# Patient Record
Sex: Female | Born: 1935 | Race: Black or African American | Hispanic: No | Smoking: Never smoker
Health system: Southern US, Community
[De-identification: ages and names within clinical notes are randomized; demographics above are authoritative.]

## PROBLEM LIST (undated history)

## (undated) DIAGNOSIS — E785 Hyperlipidemia, unspecified: Secondary | ICD-10-CM

## (undated) DIAGNOSIS — L309 Dermatitis, unspecified: Secondary | ICD-10-CM

## (undated) DIAGNOSIS — F0390 Unspecified dementia without behavioral disturbance: Secondary | ICD-10-CM

## (undated) DIAGNOSIS — I10 Essential (primary) hypertension: Secondary | ICD-10-CM

## (undated) DIAGNOSIS — R809 Proteinuria, unspecified: Secondary | ICD-10-CM

## (undated) DIAGNOSIS — R8281 Pyuria: Secondary | ICD-10-CM

## (undated) DIAGNOSIS — E119 Type 2 diabetes mellitus without complications: Secondary | ICD-10-CM

---

## 2004-08-01 ENCOUNTER — Emergency Department (HOSPITAL_COMMUNITY): Admission: EM | Admit: 2004-08-01 | Discharge: 2004-08-01 | Payer: Self-pay | Admitting: Emergency Medicine

## 2005-05-10 ENCOUNTER — Ambulatory Visit (HOSPITAL_COMMUNITY): Admission: RE | Admit: 2005-05-10 | Discharge: 2005-05-10 | Payer: Self-pay | Admitting: Gastroenterology

## 2005-12-19 ENCOUNTER — Encounter: Admission: RE | Admit: 2005-12-19 | Discharge: 2005-12-19 | Payer: Self-pay | Admitting: Internal Medicine

## 2006-05-09 ENCOUNTER — Ambulatory Visit: Payer: Self-pay | Admitting: Sports Medicine

## 2006-07-11 ENCOUNTER — Ambulatory Visit: Payer: Self-pay | Admitting: Sports Medicine

## 2006-09-05 ENCOUNTER — Ambulatory Visit: Payer: Self-pay | Admitting: Family Medicine

## 2006-09-26 ENCOUNTER — Ambulatory Visit (HOSPITAL_COMMUNITY): Admission: RE | Admit: 2006-09-26 | Discharge: 2006-09-26 | Payer: Self-pay | Admitting: Family Medicine

## 2006-09-26 ENCOUNTER — Ambulatory Visit: Payer: Self-pay | Admitting: Family Medicine

## 2006-12-28 DIAGNOSIS — M129 Arthropathy, unspecified: Secondary | ICD-10-CM | POA: Insufficient documentation

## 2006-12-28 DIAGNOSIS — E7800 Pure hypercholesterolemia, unspecified: Secondary | ICD-10-CM

## 2006-12-28 DIAGNOSIS — E119 Type 2 diabetes mellitus without complications: Secondary | ICD-10-CM | POA: Insufficient documentation

## 2006-12-28 DIAGNOSIS — I10 Essential (primary) hypertension: Secondary | ICD-10-CM

## 2007-03-13 ENCOUNTER — Telehealth: Payer: Self-pay | Admitting: *Deleted

## 2007-04-17 ENCOUNTER — Ambulatory Visit: Payer: Self-pay | Admitting: Family Medicine

## 2007-04-17 LAB — CONVERTED CEMR LAB
ALT: 18 units/L (ref 0–35)
AST: 13 units/L (ref 0–37)
Albumin: 4.4 g/dL (ref 3.5–5.2)
Alkaline Phosphatase: 82 units/L (ref 39–117)
BUN: 14 mg/dL (ref 6–23)
CO2: 26 meq/L (ref 19–32)
Calcium: 10.1 mg/dL (ref 8.4–10.5)
Chloride: 104 meq/L (ref 96–112)
Cholesterol: 201 mg/dL — ABNORMAL HIGH (ref 0–200)
Creatinine, Ser: 0.93 mg/dL (ref 0.40–1.20)
Glucose, Bld: 117 mg/dL — ABNORMAL HIGH (ref 70–99)
HDL: 60 mg/dL (ref >39)
Hgb A1c MFr Bld: 7 %
LDL Cholesterol: 112 mg/dL — ABNORMAL HIGH (ref 0–99)
Potassium: 3.9 meq/L (ref 3.5–5.3)
Sodium: 141 meq/L (ref 135–145)
Total Bilirubin: 0.5 mg/dL (ref 0.3–1.2)
Total CHOL/HDL Ratio: 3.4
Total Protein: 7.4 g/dL (ref 6.0–8.3)
Triglycerides: 145 mg/dL (ref <150)
VLDL: 29 mg/dL (ref 0–40)

## 2007-05-29 ENCOUNTER — Encounter: Payer: Self-pay | Admitting: *Deleted

## 2007-07-03 ENCOUNTER — Encounter: Payer: Self-pay | Admitting: Family Medicine

## 2007-10-12 ENCOUNTER — Encounter: Payer: Self-pay | Admitting: *Deleted

## 2008-03-31 ENCOUNTER — Emergency Department (HOSPITAL_COMMUNITY): Admission: EM | Admit: 2008-03-31 | Discharge: 2008-04-01 | Payer: Self-pay | Admitting: Emergency Medicine

## 2008-05-29 ENCOUNTER — Encounter: Payer: Self-pay | Admitting: *Deleted

## 2008-06-15 IMAGING — CT CT HEAD W/O CM
1 of 3 series · 14 of 30 positions shown, 18 images · IV contrast (agent unspecified)
Comparison: None available.

CLINICAL DATA: 84-year-old female status post fall, blurred vision.  
 HEAD CT WITHOUT CONTRAST:
TECHNIQUE: Contiguous axial images were obtained from the base of the skull through the vertex according to standard protocol without contrast.

[Series 3: recon 2: brain · axial · 0.47mm/px · z∈[+123,+239]mm · 14 of 72 slices shown, 18 images]
[im 5/72  brain]
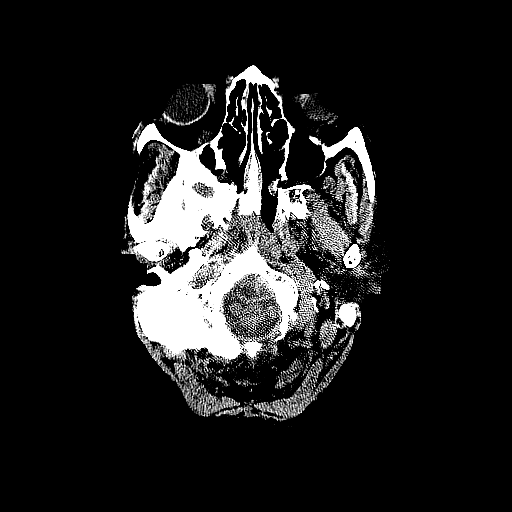
[im 5/72  bone]
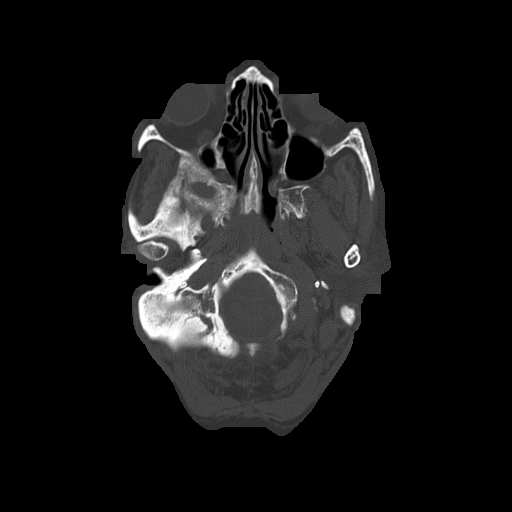
[im 10/72  brain]
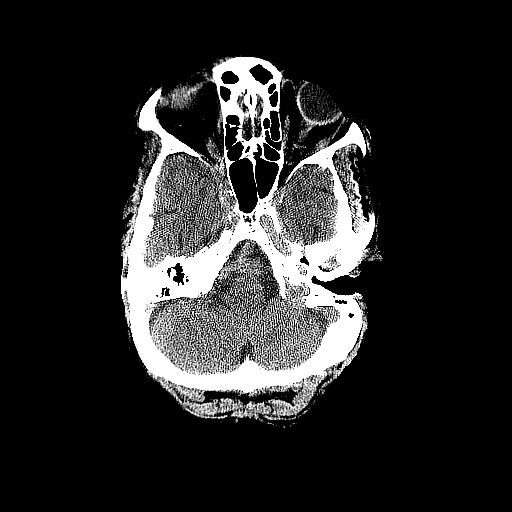
[im 15/72  brain]
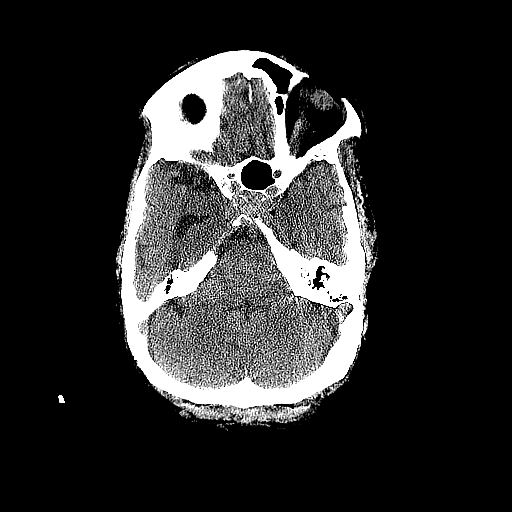
[im 19/72  brain]
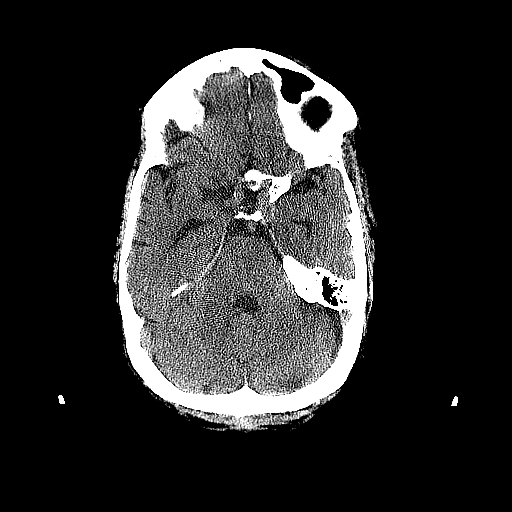
[im 24/72  brain]
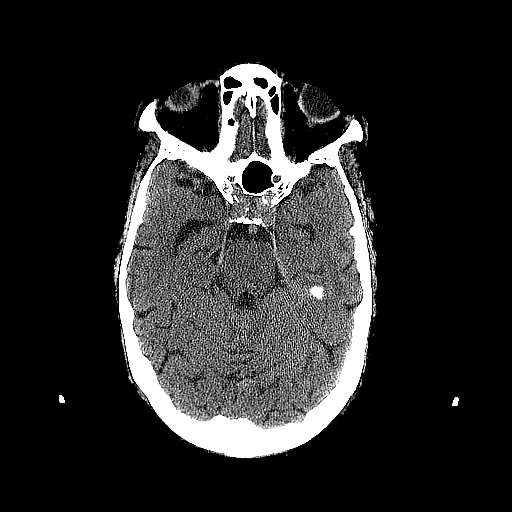
[im 24/72  bone]
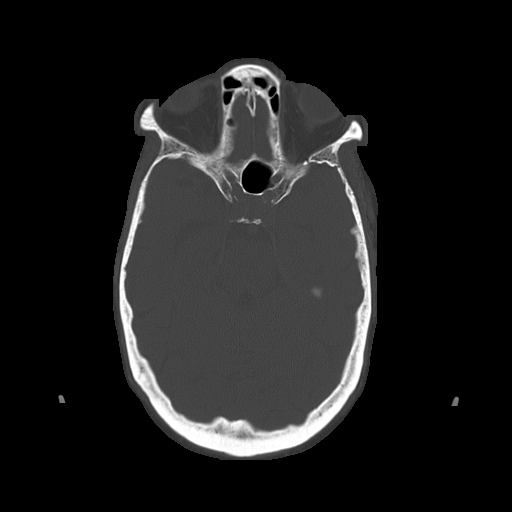
[im 29/72  brain]
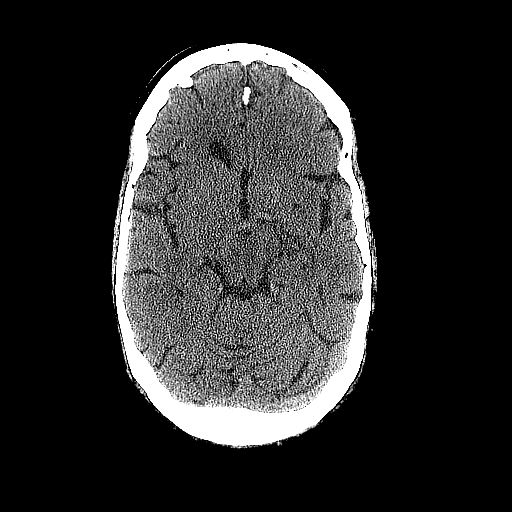
[im 34/72  brain]
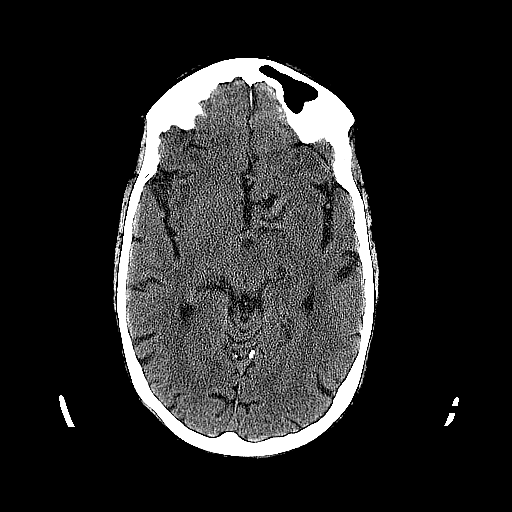
[im 38/72  brain]
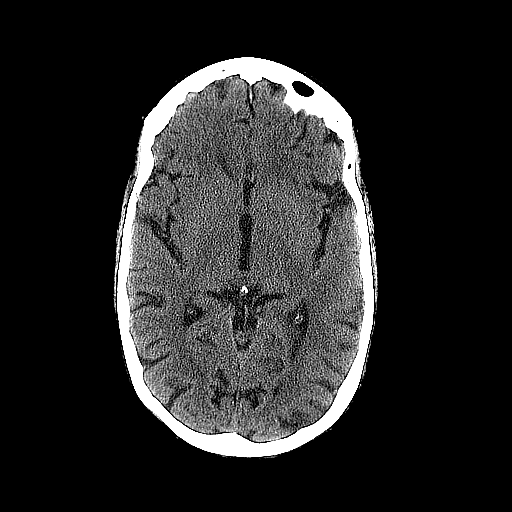
[im 43/72  brain]
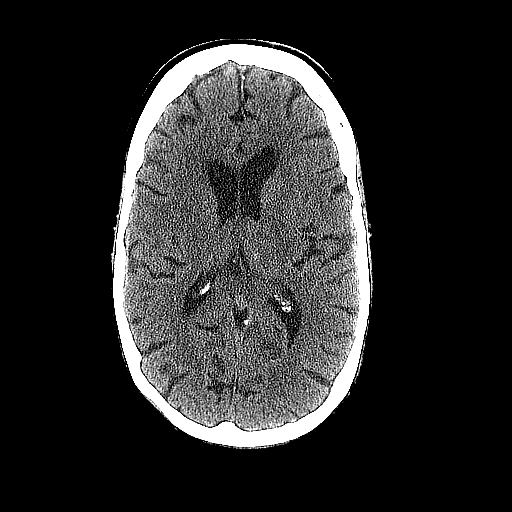
[im 43/72  bone]
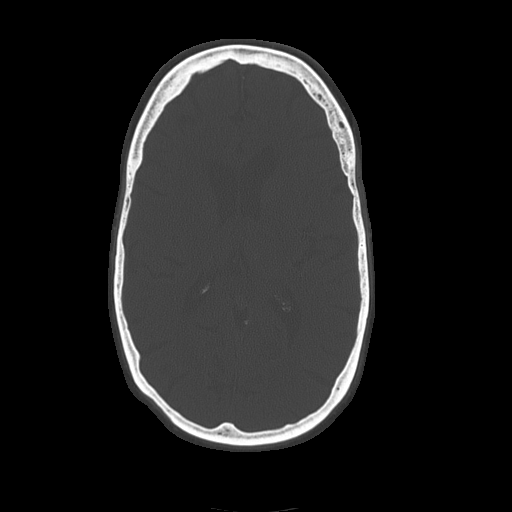
[im 48/72  brain]
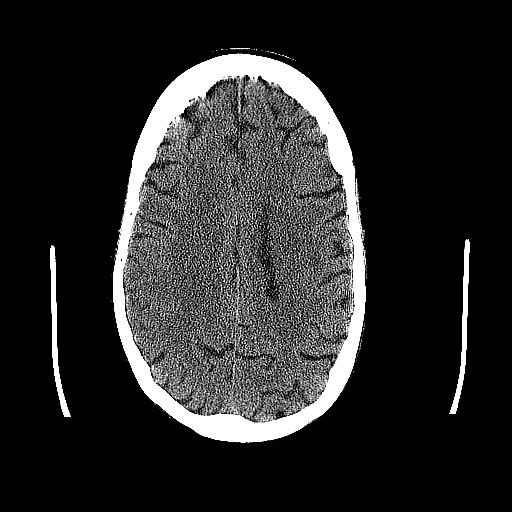
[im 53/72  brain]
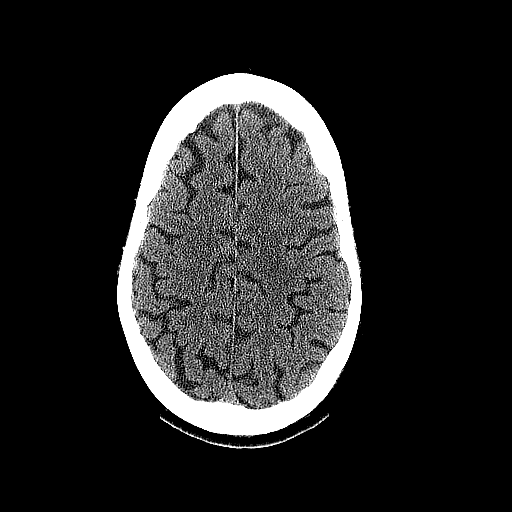
[im 57/72  brain]
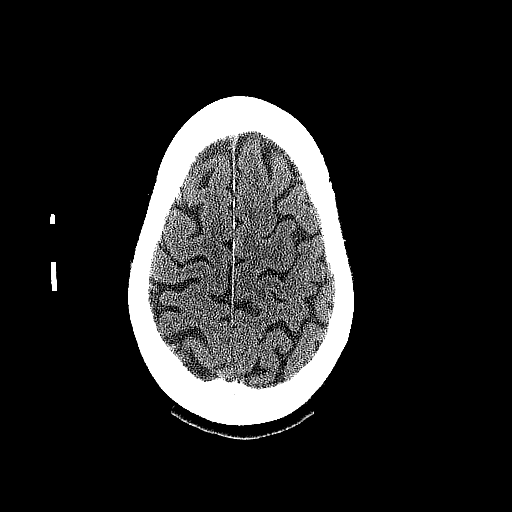
[im 62/72  brain]
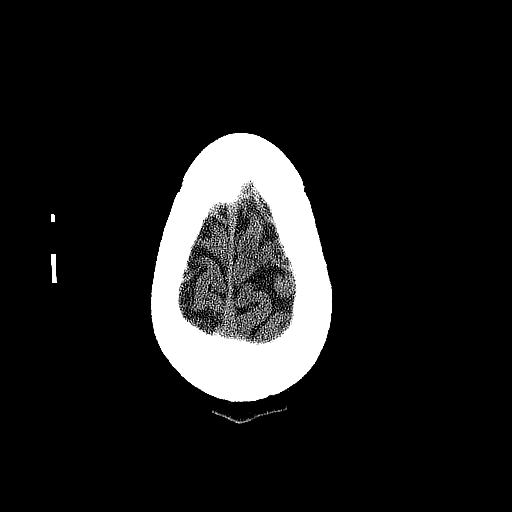
[im 62/72  bone]
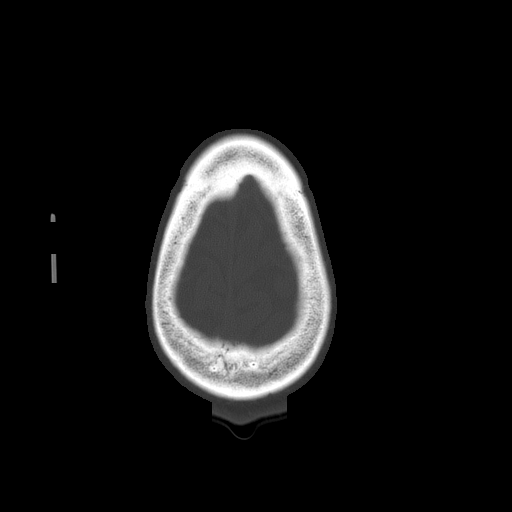
[im 67/72  brain]
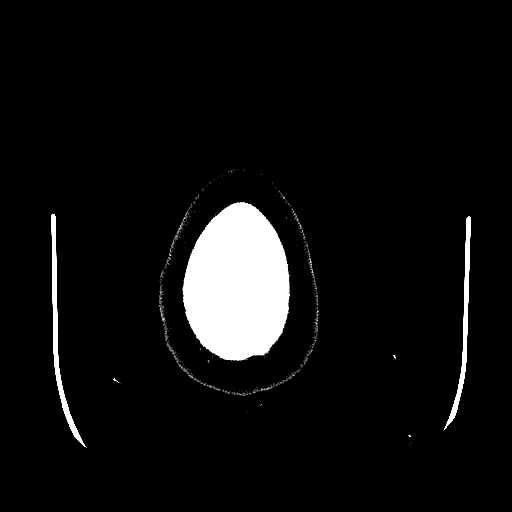

[14 of 30 positions shown; findings below may reference images not displayed]

FINDINGS: Mild atrophy is within normal limits for age.  No acute intracranial abnormality is present. Specifically, there is no evidence for acute infarct, hemorrhage, mass, hydrocephalus or extraaxial fluid collection.  There is some fullness of the cavernous sinus bilaterally.  This in part may represent ectatic internal carotid arteries.  Pituitary disease cannot be excluded.  No focal extracranial soft tissue abnormality is appreciated.  Paranasal soft tissues are within normal limits.  Right mastoid air cells are partially sclerosed.  No acute fracture is evident.
IMPRESSION: 1.  Moderate prominence of the cavernous sinus bilaterally and sellar structures.  Pituitary lesion is not excluded.  Recommend dedicated MRI for further evaluation.  
 2.  Mild atrophy is likely within normal limits for age.  
 3.  Sclerosis of right mastoid air cells compatible with chronic or recurrent right mastoiditis.  There is no active disease.  
 These findings were called to Dr. Mariely Lawley?[REDACTED] 09/26/06.

## 2009-01-13 ENCOUNTER — Emergency Department (HOSPITAL_COMMUNITY): Admission: EM | Admit: 2009-01-13 | Discharge: 2009-01-13 | Payer: Self-pay | Admitting: Emergency Medicine

## 2011-02-10 LAB — URINE MICROSCOPIC-ADD ON

## 2011-02-10 LAB — URINALYSIS, ROUTINE W REFLEX MICROSCOPIC
Bilirubin Urine: NEGATIVE
Glucose, UA: 250 mg/dL — AB
Hgb urine dipstick: NEGATIVE
Ketones, ur: 15 mg/dL — AB
Leukocytes, UA: NEGATIVE
Nitrite: NEGATIVE
Protein, ur: 100 mg/dL — AB
Specific Gravity, Urine: 1.018 (ref 1.005–1.030)
Urobilinogen, UA: 1 mg/dL (ref 0.0–1.0)
pH: 6 (ref 5.0–8.0)

## 2011-02-10 LAB — COMPREHENSIVE METABOLIC PANEL
ALT: 18 U/L (ref 0–35)
AST: 19 U/L (ref 0–37)
Albumin: 3.9 g/dL (ref 3.5–5.2)
Alkaline Phosphatase: 62 U/L (ref 39–117)
BUN: 16 mg/dL (ref 6–23)
CO2: 25 mEq/L (ref 19–32)
Calcium: 9.9 mg/dL (ref 8.4–10.5)
Chloride: 104 mEq/L (ref 96–112)
Creatinine, Ser: 1.3 mg/dL — ABNORMAL HIGH (ref 0.4–1.2)
GFR calc Af Amer: 47 mL/min — ABNORMAL LOW (ref >60)
GFR calc non Af Amer: 39 mL/min — ABNORMAL LOW (ref >60)
Glucose, Bld: 274 mg/dL — ABNORMAL HIGH (ref 70–99)
Potassium: 3.7 mEq/L (ref 3.5–5.1)
Sodium: 142 mEq/L (ref 135–145)
Total Bilirubin: 1 mg/dL (ref 0.3–1.2)
Total Protein: 7.1 g/dL (ref 6.0–8.3)

## 2011-02-10 LAB — DIFFERENTIAL
Basophils Absolute: 0 10*3/uL (ref 0.0–0.1)
Basophils Relative: 0 % (ref 0–1)
Eosinophils Absolute: 0 10*3/uL (ref 0.0–0.7)
Eosinophils Relative: 0 % (ref 0–5)
Lymphocytes Relative: 7 % — ABNORMAL LOW (ref 12–46)
Lymphs Abs: 0.7 10*3/uL (ref 0.7–4.0)
Monocytes Absolute: 0.1 10*3/uL (ref 0.1–1.0)
Monocytes Relative: 1 % — ABNORMAL LOW (ref 3–12)
Neutro Abs: 8.8 10*3/uL — ABNORMAL HIGH (ref 1.7–7.7)
Neutrophils Relative %: 91 % — ABNORMAL HIGH (ref 43–77)

## 2011-02-10 LAB — CBC
HCT: 49.4 % — ABNORMAL HIGH (ref 36.0–46.0)
Hemoglobin: 16.5 g/dL — ABNORMAL HIGH (ref 12.0–15.0)
MCHC: 33.4 g/dL (ref 30.0–36.0)
MCV: 91.3 fL (ref 78.0–100.0)
Platelets: 227 10*3/uL (ref 150–400)
RBC: 5.42 MIL/uL — ABNORMAL HIGH (ref 3.87–5.11)
RDW: 12.7 % (ref 11.5–15.5)
WBC: 9.7 10*3/uL (ref 4.0–10.5)

## 2011-02-10 LAB — URINE CULTURE: Colony Count: 10000

## 2011-02-10 LAB — LIPASE, BLOOD: Lipase: 16 U/L (ref 11–59)

## 2011-03-18 NOTE — Op Note (Signed)
NAME:  Gloria Velasquez, Gloria Velasquez              ACCOUNT NO.:  [id number]   MEDICAL RECORD NO.:  [id number]          PATIENT TYPE:  AMB   LOCATION:  ENDO                         FACILITY:  [facility]   PHYSICIAN:  Danise Edge, M.D.   DATE OF BIRTH:  01-26-1922   DATE OF PROCEDURE:  05/10/2005  DATE OF DISCHARGE:                                 OPERATIVE REPORT   PROCEDURE:  Screening flexible proctosigmoidoscopy.   PROCEDURE INDICATIONS:  [name] is an 77 year old female born  01/13/1922. [name] was scheduled to undergo her first screening  colonoscopy with polypectomy to prevent colon cancer. Due to colonic loop  formation, possibly attributed by adhesions from her remote hysterectomy, I  was unable to perform a complete colonoscopy.   I discussed with [name] the complications associated with colonoscopy  and polypectomy including a 15/1000 risk of bleeding and 10/998 risk of  colon perforation requiring surgical repair. [name] has signed the  operative permit.   ENDOSCOPIST:  Danise Edge, M.D.   PREMEDICATION:  Versed 5 mg, Demerol 30 mg.   PROCEDURE:  After obtaining informed consent, [name] was placed in the  left lateral decubitus position. I administered intravenous Demerol and  intravenous Versed to achieve conscious sedation for the procedure. The  patient's blood pressure, oxygen saturation and cardiac rhythm were  monitored throughout the procedure and documented in the medical record.   Anal inspection and digital rectal exam were normal. The Olympus adjustable  pediatric colonoscope was introduced into the rectum and advanced to  approximately 60 cm from the anal verge. Due to colonic loop formation, I  was unable to advance the colonoscope proximally. The patient was  repositioned from the left lateral decubitus position to the supine position  and finally the right lateral decubitus position but I was still unable to  advance the  endoscope more proximally.   Colonic preparation for the exam today was excellent.   Endoscopic appearance of the rectal mucosa was normal. Endoscopic appearance  of the sigmoid colon and descending colon with the endoscope advanced to 60  cm from the anal verge was normal. There was no endoscopic evidence for the  presence of colorectal neoplasia by screening flexible proctosigmoidoscopy  to 60 cm today.   ASSESSMENT:  1.  Incomplete colonoscopy due to colonic loop formation.  2.  Normal screening flexible proctosigmoidoscopy to 60 cm.   PLAN:  I will schedule [name] for a virtual colonoscopy.       MJ/MEDQ  D:  05/10/2005  T:  05/10/2005  Job:  [id number]   cc:   Ladell Pier, M.D.  Fax: [phone]

## 2011-05-27 ENCOUNTER — Emergency Department (HOSPITAL_COMMUNITY)
Admission: EM | Admit: 2011-05-27 | Discharge: 2011-05-27 | Disposition: A | Discharge status: Home / Self Care | Payer: PRIVATE HEALTH INSURANCE | Attending: Emergency Medicine | Admitting: Emergency Medicine

## 2011-05-27 DIAGNOSIS — Z049 Encounter for examination and observation for unspecified reason: Secondary | ICD-10-CM | POA: Insufficient documentation

## 2011-05-27 DIAGNOSIS — R109 Unspecified abdominal pain: Secondary | ICD-10-CM | POA: Insufficient documentation

## 2011-07-28 LAB — URINALYSIS, ROUTINE W REFLEX MICROSCOPIC
Bilirubin Urine: NEGATIVE
Glucose, UA: 100 — AB
Hgb urine dipstick: NEGATIVE
Ketones, ur: 15 — AB
Nitrite: NEGATIVE
Protein, ur: 100 — AB
Specific Gravity, Urine: 1.009
Urobilinogen, UA: 1
pH: 7

## 2011-07-28 LAB — DIFFERENTIAL
Basophils Absolute: 0
Basophils Relative: 0
Eosinophils Absolute: 0.1
Eosinophils Relative: 1
Lymphocytes Relative: 14
Lymphs Abs: 1.2
Monocytes Absolute: 0.2
Monocytes Relative: 2 — ABNORMAL LOW
Neutro Abs: 7
Neutrophils Relative %: 83 — ABNORMAL HIGH

## 2011-07-28 LAB — CBC
HCT: 39.1
Hemoglobin: 13.3
MCHC: 34
MCV: 88.7
Platelets: 236
RBC: 4.41
RDW: 12.8
WBC: 8.4

## 2011-07-28 LAB — POCT I-STAT, CHEM 8
BUN: 13
Calcium, Ion: 1.18
Chloride: 104
Creatinine, Ser: 1
Glucose, Bld: 176 — ABNORMAL HIGH
HCT: 41
Hemoglobin: 13.9
Potassium: 3.7
Sodium: 140
TCO2: 27

## 2011-07-28 LAB — URINE MICROSCOPIC-ADD ON

## 2011-07-28 LAB — POCT CARDIAC MARKERS
CKMB, poc: 1.5
Myoglobin, poc: 58.2
Operator id: 244461
Troponin i, poc: <0.05

## 2011-08-19 ENCOUNTER — Emergency Department (HOSPITAL_COMMUNITY): Payer: PRIVATE HEALTH INSURANCE

## 2011-08-19 ENCOUNTER — Emergency Department (HOSPITAL_COMMUNITY)
Admission: EM | Admit: 2011-08-19 | Discharge: 2011-08-19 | Disposition: A | Discharge status: Home / Self Care | Payer: PRIVATE HEALTH INSURANCE | Attending: Emergency Medicine | Admitting: Emergency Medicine

## 2011-08-19 DIAGNOSIS — R112 Nausea with vomiting, unspecified: Secondary | ICD-10-CM | POA: Insufficient documentation

## 2011-08-19 DIAGNOSIS — I10 Essential (primary) hypertension: Secondary | ICD-10-CM | POA: Insufficient documentation

## 2011-08-19 DIAGNOSIS — F0390 Unspecified dementia without behavioral disturbance: Secondary | ICD-10-CM | POA: Insufficient documentation

## 2011-08-19 DIAGNOSIS — E119 Type 2 diabetes mellitus without complications: Secondary | ICD-10-CM | POA: Insufficient documentation

## 2011-08-19 LAB — CBC
HCT: 40.5 % (ref 36.0–46.0)
Hemoglobin: 13.9 g/dL (ref 12.0–15.0)
MCH: 30.5 pg (ref 26.0–34.0)
MCHC: 34.3 g/dL (ref 30.0–36.0)
MCV: 88.8 fL (ref 78.0–100.0)
Platelets: 238 10*3/uL (ref 150–400)
RBC: 4.56 MIL/uL (ref 3.87–5.11)
RDW: 12.4 % (ref 11.5–15.5)
WBC: 6.1 10*3/uL (ref 4.0–10.5)

## 2011-08-19 LAB — DIFFERENTIAL
Basophils Absolute: 0 10*3/uL (ref 0.0–0.1)
Basophils Relative: 1 % (ref 0–1)
Eosinophils Absolute: 0 10*3/uL (ref 0.0–0.7)
Eosinophils Relative: 1 % (ref 0–5)
Lymphocytes Relative: 23 % (ref 12–46)
Lymphs Abs: 1.4 10*3/uL (ref 0.7–4.0)
Monocytes Absolute: 0.3 10*3/uL (ref 0.1–1.0)
Monocytes Relative: 5 % (ref 3–12)
Neutro Abs: 4.4 10*3/uL (ref 1.7–7.7)
Neutrophils Relative %: 71 % (ref 43–77)

## 2011-08-19 LAB — URINALYSIS, ROUTINE W REFLEX MICROSCOPIC
Bilirubin Urine: NEGATIVE
Glucose, UA: 500 mg/dL — AB
Hgb urine dipstick: NEGATIVE
Ketones, ur: NEGATIVE mg/dL
Nitrite: NEGATIVE
Protein, ur: NEGATIVE mg/dL
Specific Gravity, Urine: 1.013 (ref 1.005–1.030)
Urobilinogen, UA: 1 mg/dL (ref 0.0–1.0)
pH: 7 (ref 5.0–8.0)

## 2011-08-19 LAB — POCT I-STAT TROPONIN I: Troponin i, poc: 0.01 ng/mL (ref 0.00–0.08)

## 2011-08-19 LAB — URINE MICROSCOPIC-ADD ON

## 2011-08-19 LAB — BASIC METABOLIC PANEL
BUN: 11 mg/dL (ref 6–23)
CO2: 27 mEq/L (ref 19–32)
Calcium: 10.3 mg/dL (ref 8.4–10.5)
Chloride: 100 mEq/L (ref 96–112)
Creatinine, Ser: 0.92 mg/dL (ref 0.50–1.10)
GFR calc Af Amer: 63 mL/min — ABNORMAL LOW (ref >90)
GFR calc non Af Amer: 54 mL/min — ABNORMAL LOW (ref >90)
Glucose, Bld: 265 mg/dL — ABNORMAL HIGH (ref 70–99)
Potassium: 4 mEq/L (ref 3.5–5.1)
Sodium: 138 mEq/L (ref 135–145)

## 2011-08-20 LAB — URINE CULTURE: Colony Count: 55000

## 2012-06-06 ENCOUNTER — Other Ambulatory Visit: Payer: Self-pay | Admitting: Internal Medicine

## 2012-06-06 DIAGNOSIS — M8580 Other specified disorders of bone density and structure, unspecified site: Secondary | ICD-10-CM

## 2012-06-06 DIAGNOSIS — Z1231 Encounter for screening mammogram for malignant neoplasm of breast: Secondary | ICD-10-CM

## 2012-06-26 ENCOUNTER — Ambulatory Visit
Admission: RE | Admit: 2012-06-26 | Discharge: 2012-06-26 | Disposition: A | Discharge status: Home / Self Care | Payer: PRIVATE HEALTH INSURANCE | Source: Ambulatory Visit | Attending: Internal Medicine | Admitting: Internal Medicine

## 2012-06-26 DIAGNOSIS — M8580 Other specified disorders of bone density and structure, unspecified site: Secondary | ICD-10-CM

## 2012-06-26 DIAGNOSIS — Z1231 Encounter for screening mammogram for malignant neoplasm of breast: Secondary | ICD-10-CM

## 2013-05-10 ENCOUNTER — Emergency Department (HOSPITAL_COMMUNITY)
Admission: EM | Admit: 2013-05-10 | Discharge: 2013-05-10 | Disposition: A | Discharge status: Home / Self Care | Payer: PRIVATE HEALTH INSURANCE | Attending: Emergency Medicine | Admitting: Emergency Medicine

## 2013-05-10 ENCOUNTER — Encounter (HOSPITAL_COMMUNITY): Payer: Self-pay

## 2013-05-10 ENCOUNTER — Emergency Department (HOSPITAL_COMMUNITY): Payer: PRIVATE HEALTH INSURANCE

## 2013-05-10 DIAGNOSIS — Z79899 Other long term (current) drug therapy: Secondary | ICD-10-CM | POA: Insufficient documentation

## 2013-05-10 DIAGNOSIS — G309 Alzheimer's disease, unspecified: Secondary | ICD-10-CM | POA: Insufficient documentation

## 2013-05-10 DIAGNOSIS — Z872 Personal history of diseases of the skin and subcutaneous tissue: Secondary | ICD-10-CM | POA: Insufficient documentation

## 2013-05-10 DIAGNOSIS — F0280 Dementia in other diseases classified elsewhere without behavioral disturbance: Secondary | ICD-10-CM | POA: Insufficient documentation

## 2013-05-10 DIAGNOSIS — I10 Essential (primary) hypertension: Secondary | ICD-10-CM | POA: Insufficient documentation

## 2013-05-10 DIAGNOSIS — R109 Unspecified abdominal pain: Secondary | ICD-10-CM | POA: Insufficient documentation

## 2013-05-10 DIAGNOSIS — Z87440 Personal history of urinary (tract) infections: Secondary | ICD-10-CM | POA: Insufficient documentation

## 2013-05-10 DIAGNOSIS — E785 Hyperlipidemia, unspecified: Secondary | ICD-10-CM | POA: Insufficient documentation

## 2013-05-10 DIAGNOSIS — E119 Type 2 diabetes mellitus without complications: Secondary | ICD-10-CM | POA: Insufficient documentation

## 2013-05-10 HISTORY — DX: Unspecified dementia, unspecified severity, without behavioral disturbance, psychotic disturbance, mood disturbance, and anxiety: F03.90

## 2013-05-10 HISTORY — DX: Dermatitis, unspecified: L30.9

## 2013-05-10 HISTORY — DX: Hyperlipidemia, unspecified: E78.5

## 2013-05-10 HISTORY — DX: Proteinuria, unspecified: R80.9

## 2013-05-10 HISTORY — DX: Essential (primary) hypertension: I10

## 2013-05-10 HISTORY — DX: Type 2 diabetes mellitus without complications: E11.9

## 2013-05-10 HISTORY — DX: Pyuria: R82.81

## 2013-05-10 LAB — CBC WITH DIFFERENTIAL/PLATELET
Basophils Relative: 1 % (ref 0–1)
Eosinophils Absolute: 0.1 10*3/uL (ref 0.0–0.7)
MCH: 29.7 pg (ref 26.0–34.0)
MCHC: 33.8 g/dL (ref 30.0–36.0)
Neutrophils Relative %: 51 % (ref 43–77)
Platelets: 205 10*3/uL (ref 150–400)

## 2013-05-10 LAB — COMPREHENSIVE METABOLIC PANEL
ALT: 13 U/L (ref 0–35)
Albumin: 3.7 g/dL (ref 3.5–5.2)
Alkaline Phosphatase: 63 U/L (ref 39–117)
BUN: 10 mg/dL (ref 6–23)
Potassium: 3 mEq/L — ABNORMAL LOW (ref 3.5–5.1)
Sodium: 141 mEq/L (ref 135–145)
Total Protein: 7.2 g/dL (ref 6.0–8.3)

## 2013-05-10 LAB — URINE MICROSCOPIC-ADD ON

## 2013-05-10 LAB — URINALYSIS, ROUTINE W REFLEX MICROSCOPIC
Bilirubin Urine: NEGATIVE
Nitrite: NEGATIVE
Specific Gravity, Urine: 1.009 (ref 1.005–1.030)
pH: 7 (ref 5.0–8.0)

## 2013-05-10 LAB — LIPASE, BLOOD: Lipase: 30 U/L (ref 11–59)

## 2013-05-10 LAB — LACTIC ACID, PLASMA: Lactic Acid, Venous: 2.5 mmol/L — ABNORMAL HIGH (ref 0.5–2.2)

## 2013-05-10 MED ORDER — IOHEXOL 300 MG/ML  SOLN
100.0000 mL | Freq: Once | INTRAMUSCULAR | Status: AC | PRN
  Administered 2013-05-10: 100 mL via INTRAVENOUS

## 2013-05-10 MED ORDER — POTASSIUM CHLORIDE CRYS ER 20 MEQ PO TBCR
60.0000 meq | EXTENDED_RELEASE_TABLET | Freq: Once | ORAL | Status: AC
  Administered 2013-05-10: 60 meq via ORAL
  Filled 2013-05-10: qty 3

## 2013-05-10 MED ORDER — SODIUM CHLORIDE 0.9 % IV BOLUS (SEPSIS)
1000.0000 mL | Freq: Once | INTRAVENOUS | Status: AC
  Administered 2013-05-10: 1000 mL via INTRAVENOUS

## 2013-05-10 MED ORDER — IOHEXOL 300 MG/ML  SOLN
25.0000 mL | INTRAMUSCULAR | Status: AC
  Administered 2013-05-10: 25 mL via ORAL

## 2013-05-10 NOTE — ED Notes (Signed)
Patient up to restroom with family member with NAD.

## 2013-05-10 NOTE — ED Notes (Signed)
Patient's daughter states the patient has not been eating or drinking well x 2 weeks. Patient appears to have dementia and is not a good historian. Family denies any health issues other that patient telling family has right side abdominal pain.

## 2013-05-10 NOTE — ED Notes (Signed)
Pt presents with 1 week h/o abdominal pain. +nausea;  Pt reports intermittent dysuria.  Daughter reports pt has had good appetite, reports umbilical pain.

## 2013-05-10 NOTE — ED Provider Notes (Signed)
Pt care assumed from Dr Mora Bellman, see his note for complete H&P. Briefly pt w/ alzheimer's now w/ abd pain x 2 days, possible RLQ pain, labs unremarkable, CT and u/a pending. Vitals stable, NAD  Reassessed, vitals stable, NAD, denies pain, abd soft and benign. Nttp. Repeat lactic acid cleared w/ 1 L IVf. CT abd - no acute findings. U/a neg for infection. Stable for d/c home. Follow up w/ pcp on Monday. Given strict return precautions.   1. Abdominal  pain, other specified site       follow up with your primary care doctor on monday   Results for orders placed during the hospital encounter of 05/10/13  CBC WITH DIFFERENTIAL      Result Value Range   WBC 4.7  4.0 - 10.5 K/uL   RBC 4.44  3.87 - 5.11 MIL/uL   Hemoglobin 13.2  12.0 - 15.0 g/dL   HCT 40.9  81.1 - 91.4 %   MCV 88.1  78.0 - 100.0 fL   MCH 29.7  26.0 - 34.0 pg   MCHC 33.8  30.0 - 36.0 g/dL   RDW 78.2  95.6 - 21.3 %   Platelets 205  150 - 400 K/uL   Neutrophils Relative % 51  43 - 77 %   Neutro Abs 2.4  1.7 - 7.7 K/uL   Lymphocytes Relative 40  12 - 46 %   Lymphs Abs 1.9  0.7 - 4.0 K/uL   Monocytes Relative 6  3 - 12 %   Monocytes Absolute 0.3  0.1 - 1.0 K/uL   Eosinophils Relative 3  0 - 5 %   Eosinophils Absolute 0.1  0.0 - 0.7 K/uL   Basophils Relative 1  0 - 1 %   Basophils Absolute 0.1  0.0 - 0.1 K/uL  COMPREHENSIVE METABOLIC PANEL      Result Value Range   Sodium 141  135 - 145 mEq/L   Potassium 3.0 (*) 3.5 - 5.1 mEq/L   Chloride 101  96 - 112 mEq/L   CO2 29  19 - 32 mEq/L   Glucose, Bld 104 (*) 70 - 99 mg/dL   BUN 10  6 - 23 mg/dL   Creatinine, Ser 0.86  0.50 - 1.10 mg/dL   Calcium 57.8  8.4 - 46.9 mg/dL   Total Protein 7.2  6.0 - 8.3 g/dL   Albumin 3.7  3.5 - 5.2 g/dL   AST 12  0 - 37 U/L   ALT 13  0 - 35 U/L   Alkaline Phosphatase 63  39 - 117 U/L   Total Bilirubin 0.3  0.3 - 1.2 mg/dL   GFR calc non Af Amer 54 (*) >90 mL/min   GFR calc Af Amer 63 (*) >90 mL/min  LIPASE, BLOOD      Result Value Range   Lipase 30  11 - 59 U/L  URINALYSIS, ROUTINE W REFLEX MICROSCOPIC      Result Value Range   Color, Urine YELLOW  YELLOW   APPearance CLEAR  CLEAR   Specific Gravity, Urine 1.009  1.005 - 1.030   pH 7.0  5.0 - 8.0   Glucose, UA NEGATIVE  NEGATIVE mg/dL   Hgb urine dipstick NEGATIVE  NEGATIVE   Bilirubin Urine NEGATIVE  NEGATIVE   Ketones, ur NEGATIVE  NEGATIVE mg/dL   Protein, ur NEGATIVE  NEGATIVE mg/dL   Urobilinogen, UA 0.2  0.0 - 1.0 mg/dL   Nitrite NEGATIVE  NEGATIVE   Leukocytes, UA LARGE (*) NEGATIVE  LACTIC ACID, PLASMA      Result Value Range   Lactic Acid, Venous 2.5 (*) 0.5 - 2.2 mmol/L  URINE MICROSCOPIC-ADD ON      Result Value Range   Squamous Epithelial / LPF FEW (*) RARE   WBC, UA 3-6  <3 WBC/hpf  CG4 I-STAT (LACTIC ACID)      Result Value Range   Lactic Acid, Venous 1.71  0.5 - 2.2 mmol/L   CT Abdomen Pelvis W Contrast (Final result)  Result time: 05/10/13 18:28:40    Final result by Rad Results In Interface (05/10/13 18:28:40)    Narrative:   *RADIOLOGY REPORT*  Clinical Data: Right-sided abdominal pain. Anorexia. Clinical suspicion for appendicitis.  CT ABDOMEN AND PELVIS WITH CONTRAST  Technique: Multidetector CT imaging of the abdomen and pelvis was performed following the standard protocol during bolus administration of intravenous contrast.  Contrast: OMNIPAQUE IOHEXOL 300 MG/ML SOLN  Comparison: None.  Findings: A few tiny hepatic cysts are seen but no liver masses are identified. Surgical clips seen from prior cholecystectomy. The spleen, pancreas, and adrenal glands are normal appearance. A few small right renal cysts are also noted but there is no evidence of renal masses or hydronephrosis.  Prior hysterectomy noted. Adnexal regions are unremarkable. No soft tissue masses or lymphadenopathy identified within the abdomen or pelvis. No evidence of inflammatory process or abnormal fluid collections. Index is not directly visualized  but there are no findings of appendicitis. No evidence of dilated bowel loops or hernia.  IMPRESSION: No evidence of appendicitis or other acute findings.   Original Report Authenticated By: Myles Rosenthal, M.D.          Audelia Hives, MD 05/10/13 2013

## 2013-05-10 NOTE — ED Notes (Signed)
Pt's family comfortable with d/c instructions.

## 2013-05-10 NOTE — ED Provider Notes (Signed)
History    CSN: [id number] Arrival date & time 05/10/13  1301  First MD Initiated Contact with Patient 05/10/13 1423     No chief complaint on file.  (Consider location/radiation/quality/duration/timing/severity/associated sxs/prior Treatment) HPI Comments: Gloria Velasquez is a 79 y.o. female h/o alzheimer's, here with intermittent abdominal pain for the past week.  Per family, patient will randomly admit to the pain without being able to describe it.  It is worse in the mornings and at night.  They deny any N/V/D changes in output, fevers, chills, or AMS.  Patient is at her baseline mentally.  She does have a history of frequent UTIs.  ROs is unobtainable.  The history is provided by the patient and a relative.   Past Medical History  Diagnosis Date  . Microalbuminuria   . Pyuria   . Dermatitis   . Dementia   . Diabetes mellitus without complication   . Hyperlipidemia   . Hypertension    History reviewed. No pertinent past surgical history. History reviewed. No pertinent family history. History  Substance Use Topics  . Smoking status: Never Smoker   . Smokeless tobacco: Not on file  . Alcohol Use: No   OB History   Grav Para Term Preterm Abortions TAB SAB Ect Mult Living                 Review of Systems  Unable to perform ROS: Age    Allergies  Review of patient's allergies indicates no known allergies.  Home Medications   Current Outpatient Rx  Name  Route  Sig  Dispense  Refill  . amLODipine (NORVASC) 5 MG tablet   Oral   Take 5 mg by mouth daily.         Marland Kitchen donepezil (ARICEPT) 10 MG tablet   Oral   Take 10 mg by mouth at bedtime.         Marland Kitchen glyBURIDE (DIABETA) 5 MG tablet   Oral   Take 5 mg by mouth 2 (two) times daily with a meal.         . simvastatin (ZOCOR) 40 MG tablet   Oral   Take 40 mg by mouth every evening.          BP 157/76  Pulse 60  Temp(Src) 98 F (36.7 C) (Oral)  Resp 18  SpO2 99% Physical Exam  Nursing note and  vitals reviewed. Constitutional: She is oriented to person, place, and time. She appears well-developed and well-nourished. No distress.  HENT:  Head: Normocephalic and atraumatic.  Nose: Nose normal.  Mouth/Throat: Oropharynx is clear and moist. No oropharyngeal exudate.  Eyes: Conjunctivae and EOM are normal. Pupils are equal, round, and reactive to light. No scleral icterus.  Neck: Normal range of motion. Neck supple. No JVD present. No tracheal deviation present. No thyromegaly present.  Cardiovascular: Normal rate, regular rhythm and normal heart sounds.  Exam reveals no gallop and no friction rub.   No murmur heard. Pulmonary/Chest: Effort normal and breath sounds normal. No respiratory distress. She has no wheezes. She exhibits no tenderness.  Abdominal: Soft. Bowel sounds are normal. She exhibits no distension and no mass. There is no tenderness. There is no rebound and no guarding.  Musculoskeletal: Normal range of motion. She exhibits no edema and no tenderness.  Lymphadenopathy:    She has no cervical adenopathy.  Neurological: She is alert and oriented to person, place, and time.  Skin: Skin is warm and dry. No rash noted. No  erythema. No pallor.    ED Course  Procedures (including critical care time) Labs Reviewed  COMPREHENSIVE METABOLIC PANEL - Abnormal; Notable for the following:    Potassium 3.0 (*)    Glucose, Bld 104 (*)    GFR calc non Af Amer 54 (*)    GFR calc Af Amer 63 (*)    All other components within normal limits  URINALYSIS, ROUTINE W REFLEX MICROSCOPIC - Abnormal; Notable for the following:    Leukocytes, UA LARGE (*)    All other components within normal limits  LACTIC ACID, PLASMA - Abnormal; Notable for the following:    Lactic Acid, Venous 2.5 (*)    All other components within normal limits  URINE MICROSCOPIC-ADD ON - Abnormal; Notable for the following:    Squamous Epithelial / LPF FEW (*)    All other components within normal limits  CBC WITH  DIFFERENTIAL  LIPASE, BLOOD  CG4 I-STAT (LACTIC ACID)   No results found. No diagnosis found.  MDM  Patient will be evaluated for abdominal pathology since she can not provide a good history.  If she remains pain free, and can tolerate a full meal, we will be able to discharge her home.  If not, we will obtain a CT of the abd/pelvis.  Patient signed out to Dr. Gary Fleet.  Patient can go home if CT is normal. Admit if positive.  Tomasita Crumble, MD 05/17/13 1414

## 2013-05-11 NOTE — ED Provider Notes (Signed)
I performed a history and physical examination of  Gloria Velasquez and discussed her management with Dr. Mora Bellman and Dr. Gary Fleet. I agree with the history, physical, assessment, and plan of care, with the following exceptions: None I was present for the following procedures: None  Time Spent in Critical Care of the patient: None  Time spent in discussions with the patient and family: 5-10 minutes  Dementia, abd pain on exam, right sided. Will screen for infection, dispo pending Ct abd  Maruice Pieroni   Derwood Kaplan, MD 05/11/13 [phone]

## 2013-05-22 NOTE — ED Provider Notes (Signed)
I performed a history and physical examination of  Gloria Velasquez and discussed her management with Dr. Mora Bellman. I agree with the history, physical, assessment, and plan of care, with the following exceptions: None I was present for the following procedures: None  Time Spent in Critical Care of the patient: None  Time spent in discussions with the patient and family: 10 minutes  Gloria Velasquez   Derwood Kaplan, MD 05/22/13 [phone]

## 2013-10-28 ENCOUNTER — Ambulatory Visit (INDEPENDENT_AMBULATORY_CARE_PROVIDER_SITE_OTHER): Payer: Medicare Other | Admitting: Emergency Medicine

## 2013-10-28 VITALS — BP 130/82 | HR 62 | Temp 97.7°F | Resp 18 | Ht 62.0 in | Wt 116.0 lb

## 2013-10-28 DIAGNOSIS — Z111 Encounter for screening for respiratory tuberculosis: Secondary | ICD-10-CM

## 2013-10-28 DIAGNOSIS — F0391 Unspecified dementia with behavioral disturbance: Secondary | ICD-10-CM

## 2013-10-28 DIAGNOSIS — E119 Type 2 diabetes mellitus without complications: Secondary | ICD-10-CM

## 2013-10-28 NOTE — Progress Notes (Signed)
[facility] [address], [place] [address] [phone]  Date:  10/28/2013   Name:  Gloria Velasquez   DOB:  [birth year]   MRN:  [id number]  PCP:  No primary provider on file.    Chief Complaint: Immunizations   History of Present Illness:  Gloria Velasquez is a 79 y.o. very pleasant female patient who presents with the following:  History of NIDDM and not compliant with glucose management due to her living alone and dementia.  Now pending placement in a nursing home prior to moving in.  She has no acute complaint.  Brought by nephew.    Patient Active Problem List   Diagnosis Date Noted  . DIABETES MELLITUS II, UNCOMPLICATED 12/28/2006  . HYPERCHOLESTEROLEMIA 12/28/2006  . HYPERTENSION, BENIGN SYSTEMIC 12/28/2006  . ARTHRITIS 12/28/2006    Past Medical History  Diagnosis Date  . Microalbuminuria   . Pyuria   . Dermatitis   . Dementia   . Diabetes mellitus without complication   . Hyperlipidemia   . Hypertension     No past surgical history on file.  History  Substance Use Topics  . Smoking status: Never Smoker   . Smokeless tobacco: Not on file  . Alcohol Use: No    No family history on file.  No Known Allergies  Medication list has been reviewed and updated.  Current Outpatient Prescriptions on File Prior to Visit  Medication Sig Dispense Refill  . amLODipine (NORVASC) 5 MG tablet Take 5 mg by mouth daily.      Marland Kitchen donepezil (ARICEPT) 10 MG tablet Take 10 mg by mouth at bedtime.      Marland Kitchen glyBURIDE (DIABETA) 5 MG tablet Take 5 mg by mouth 2 (two) times daily with a meal.      . simvastatin (ZOCOR) 40 MG tablet Take 40 mg by mouth every evening.       No current facility-administered medications on file prior to visit.    Review of Systems:  As per HPI, otherwise negative.    Physical Examination: Filed Vitals:   10/28/13 1353  BP: 130/82  Pulse: 62  Temp: 97.7 F (36.5 C)  Resp: 18   Filed Vitals:   10/28/13 1353   Height: 5\' 2"  (1.575 m)  Weight: 116 lb (52.617 kg)   Body mass index is 21.21 kg/(m^2). Ideal Body Weight: Weight in (lb) to have BMI = 25: 136.4   GEN: WDWN, NAD, Non-toxic, Alert & Oriented x 2 HEENT: Atraumatic, Normocephalic.  Ears and Nose: No external deformity. EXTR: No clubbing/cyanosis/edema NEURO: Normal gait.  PSYCH: Normally interactive. Conversant. Not depressed or anxious appearing.  Calm demeanor.    Assessment and Plan: Dementia NIDDM TBST Follow up two-three days  Signed,  Phillips Odor, MD

## 2013-10-30 ENCOUNTER — Ambulatory Visit (INDEPENDENT_AMBULATORY_CARE_PROVIDER_SITE_OTHER): Payer: Medicare Other | Admitting: *Deleted

## 2013-10-30 DIAGNOSIS — Z111 Encounter for screening for respiratory tuberculosis: Secondary | ICD-10-CM

## 2013-10-30 LAB — TB SKIN TEST: Induration: 0 mm

## 2014-06-25 ENCOUNTER — Emergency Department (HOSPITAL_COMMUNITY)
Admission: EM | Admit: 2014-06-25 | Discharge: 2014-06-25 | Disposition: A | Discharge status: Home / Self Care | Payer: PRIVATE HEALTH INSURANCE | Attending: Emergency Medicine | Admitting: Emergency Medicine

## 2014-06-25 ENCOUNTER — Encounter (HOSPITAL_COMMUNITY): Payer: Self-pay | Admitting: Emergency Medicine

## 2014-06-25 DIAGNOSIS — R109 Unspecified abdominal pain: Secondary | ICD-10-CM | POA: Insufficient documentation

## 2014-06-25 DIAGNOSIS — E785 Hyperlipidemia, unspecified: Secondary | ICD-10-CM | POA: Diagnosis not present

## 2014-06-25 DIAGNOSIS — Z872 Personal history of diseases of the skin and subcutaneous tissue: Secondary | ICD-10-CM | POA: Diagnosis not present

## 2014-06-25 DIAGNOSIS — I10 Essential (primary) hypertension: Secondary | ICD-10-CM | POA: Diagnosis present

## 2014-06-25 DIAGNOSIS — R52 Pain, unspecified: Secondary | ICD-10-CM | POA: Diagnosis not present

## 2014-06-25 DIAGNOSIS — E119 Type 2 diabetes mellitus without complications: Secondary | ICD-10-CM | POA: Diagnosis not present

## 2014-06-25 DIAGNOSIS — F0390 Unspecified dementia without behavioral disturbance: Secondary | ICD-10-CM | POA: Diagnosis not present

## 2014-06-25 DIAGNOSIS — Z79899 Other long term (current) drug therapy: Secondary | ICD-10-CM | POA: Insufficient documentation

## 2014-06-25 LAB — COMPREHENSIVE METABOLIC PANEL
ALK PHOS: 72 U/L (ref 39–117)
ALT: 12 U/L (ref 0–35)
AST: 13 U/L (ref 0–37)
Albumin: 3.6 g/dL (ref 3.5–5.2)
Anion gap: 12 (ref 5–15)
BILIRUBIN TOTAL: 0.2 mg/dL — AB (ref 0.3–1.2)
BUN: 13 mg/dL (ref 6–23)
CHLORIDE: 101 meq/L (ref 96–112)
CO2: 28 meq/L (ref 19–32)
Calcium: 10.1 mg/dL (ref 8.4–10.5)
Creatinine, Ser: 0.79 mg/dL (ref 0.50–1.10)
GFR, EST AFRICAN AMERICAN: 82 mL/min — AB (ref >90)
GFR, EST NON AFRICAN AMERICAN: 71 mL/min — AB (ref >90)
GLUCOSE: 117 mg/dL — AB (ref 70–99)
POTASSIUM: 4 meq/L (ref 3.7–5.3)
SODIUM: 141 meq/L (ref 137–147)
TOTAL PROTEIN: 7.2 g/dL (ref 6.0–8.3)

## 2014-06-25 LAB — CBC WITH DIFFERENTIAL/PLATELET
Basophils Absolute: 0 10*3/uL (ref 0.0–0.1)
Basophils Relative: 1 % (ref 0–1)
Eosinophils Absolute: 0.1 10*3/uL (ref 0.0–0.7)
Eosinophils Relative: 2 % (ref 0–5)
HCT: 36.3 % (ref 36.0–46.0)
HEMOGLOBIN: 12 g/dL (ref 12.0–15.0)
LYMPHS ABS: 2.2 10*3/uL (ref 0.7–4.0)
LYMPHS PCT: 39 % (ref 12–46)
MCH: 30.1 pg (ref 26.0–34.0)
MCHC: 33.1 g/dL (ref 30.0–36.0)
MCV: 91 fL (ref 78.0–100.0)
Monocytes Absolute: 0.4 10*3/uL (ref 0.1–1.0)
Monocytes Relative: 7 % (ref 3–12)
NEUTROS ABS: 2.9 10*3/uL (ref 1.7–7.7)
NEUTROS PCT: 51 % (ref 43–77)
PLATELETS: 227 10*3/uL (ref 150–400)
RBC: 3.99 MIL/uL (ref 3.87–5.11)
RDW: 12.5 % (ref 11.5–15.5)
WBC: 5.7 10*3/uL (ref 4.0–10.5)

## 2014-06-25 LAB — URINALYSIS, ROUTINE W REFLEX MICROSCOPIC
BILIRUBIN URINE: NEGATIVE
GLUCOSE, UA: NEGATIVE mg/dL
HGB URINE DIPSTICK: NEGATIVE
Ketones, ur: NEGATIVE mg/dL
NITRITE: NEGATIVE
Protein, ur: NEGATIVE mg/dL
SPECIFIC GRAVITY, URINE: 1.008 (ref 1.005–1.030)
Urobilinogen, UA: 0.2 mg/dL (ref 0.0–1.0)
pH: 7.5 (ref 5.0–8.0)

## 2014-06-25 LAB — URINE MICROSCOPIC-ADD ON

## 2014-06-25 LAB — I-STAT TROPONIN, ED: Troponin i, poc: 0 ng/mL (ref 0.00–0.08)

## 2014-06-25 LAB — LIPASE, BLOOD: Lipase: 30 U/L (ref 11–59)

## 2014-06-25 MED ORDER — AMLODIPINE BESYLATE 5 MG PO TABS
5.0000 mg | ORAL_TABLET | Freq: Once | ORAL | Status: AC
  Administered 2014-06-25: 5 mg via ORAL
  Filled 2014-06-25: qty 1

## 2014-06-25 NOTE — ED Notes (Addendum)
Pt from nursing facility. Per EMS pt co generalized weakness, abdominal pain. Pt denies N/V/D. Pt alert oriented for base line.

## 2014-06-25 NOTE — ED Provider Notes (Signed)
TIME SEEN: 8:00 PM  CHIEF COMPLAINT: Hypertension, abdominal pain  HPI: Patient is a 80 y.o. F with history of dementia, hypertension, hyperlipidemia, diabetes who presents to the emergency department with an episode of abdominal pain after eating. Nursing home staff reports the patient grabbed her abdomen after she was eating. It is unclear how long this pain lasted. In the ED, patient denies any pain. She denies abdominal pain, chest pain, back pain, extremity pain. Denies numbness, tingling or focal weakness. No headache. No blurry vision. Denies shortness of breath. She is unable to recall having pain after eating.  Nursing home staff reports patient is also hypertensive. Blood pressure here is elevated but she is asymptomatic. She is only on amlodipine once daily.  ROS: Level V caveat for dementia  PAST MEDICAL HISTORY/PAST SURGICAL HISTORY:  Past Medical History  Diagnosis Date  . Microalbuminuria   . Pyuria   . Dermatitis   . Dementia   . Diabetes mellitus without complication   . Hyperlipidemia   . Hypertension     MEDICATIONS:  Prior to Admission medications   Medication Sig Start Date End Date Taking? Authorizing Provider  amLODipine (NORVASC) 5 MG tablet Take 5 mg by mouth daily.    Historical Provider, MD  donepezil (ARICEPT) 10 MG tablet Take 10 mg by mouth at bedtime.    Historical Provider, MD  glyBURIDE (DIABETA) 5 MG tablet Take 5 mg by mouth 2 (two) times daily with a meal.    Historical Provider, MD  simvastatin (ZOCOR) 40 MG tablet Take 40 mg by mouth every evening.    Historical Provider, MD    ALLERGIES:  No Known Allergies  SOCIAL HISTORY:  History  Substance Use Topics  . Smoking status: Never Smoker   . Smokeless tobacco: Not on file  . Alcohol Use: No    FAMILY HISTORY: No family history on file.  EXAM: BP 203/88  Pulse 77  Temp(Src) 97.8 F (36.6 C) (Oral)  Resp 20  SpO2 100% CONSTITUTIONAL: Alert and oriented to person but not place or  time and responds appropriately to questions intermittently. Well-appearing; well-nourished, in no distress, nontoxic, smiling HEAD: Normocephalic EYES: Conjunctivae clear, PERRL ENT: normal nose; no rhinorrhea; moist mucous membranes; pharynx without lesions noted NECK: Supple, no meningismus, no LAD  CARD: RRR; S1 and S2 appreciated; no murmurs, no clicks, no rubs, no gallops RESP: Normal chest excursion without splinting or tachypnea; breath sounds clear and equal bilaterally; no wheezes, no rhonchi, no rales, no respiratory distress or hypoxia, no increased work of breathing ABD/GI: Normal bowel sounds; non-distended; soft, non-tender, no rebound, no guarding, no pulsatile mass, no peritoneal signs BACK:  The back appears normal and is non-tender to palpation, there is no CVA tenderness EXT: Normal ROM in all joints; non-tender to palpation; no edema; normal capillary refill; no cyanosis, 2+ radial and DP pulses bilaterally SKIN: Normal color for age and race; warm NEURO: Moves all extremities equally, sensation to light touch intact diffusely, cranial nerves II through XII intact, no pronator drift PSYCH: The patient's mood and manner are appropriate. Grooming and personal hygiene are appropriate.  MEDICAL DECISION MAKING: Patient here with asymptomatic hypertension. Per nursing home staff, patient was grabbing her abdomen after eating. She has no complaints of pain currently and her abdominal exam is completely benign. She is mildly hypertensive. We'll give an extra dose of Norvasc and continue to monitor. Given history is limited because she is demented, will obtain basic labs, LFTs, lipase, troponin, urine. Will  continue to closely monitor.  ED PROGRESS: Patient's labs are unremarkable. She still pain-free. Her blood pressure has improved. Family is bedtime. Have discussed with him that we cannot find an etiology for patient's complaints of pain earlier today and that are history is limited  given her dementia. They verbalize that they understand this. They're comfortable with sending her back to the nursing home facility. Have discussed strict return precautions. They're comfortable with this plan.    EKG Interpretation  Date/Time:  Wednesday June 25 2014 19:33:29 EDT Ventricular Rate:  67 PR Interval:  185 QRS Duration: 93 QT Interval:  426 QTC Calculation: 450 R Axis:   3 Text Interpretation:  Sinus rhythm Atrial premature complex Confirmed by Georgiana Spillane,  DO, Bridgette Wolden ([id number]) on 06/26/2014 12:06:53 AM        Layla Maw Kyran Connaughton, DO 06/26/14 0007

## 2014-06-25 NOTE — ED Notes (Signed)
Called nursing home. Med tech states the pt grabbed her abdomen and appeared to be in pain after eating dinner. Med tech is also concerned about the pt's BP, which was 175/78.

## 2014-06-25 NOTE — Discharge Instructions (Signed)
Abdominal Pain °Many things can cause abdominal pain. Usually, abdominal pain is not caused by a disease and will improve without treatment. It can often be observed and treated at home. Your health care provider will do a physical exam and possibly order blood tests and X-rays to help determine the seriousness of your pain. However, in many cases, more time must pass before a clear cause of the pain can be found. Before that point, your health care provider may not know if you need more testing or further treatment. °HOME CARE INSTRUCTIONS  °Monitor your abdominal pain for any changes. The following actions may help to alleviate any discomfort you are experiencing: °· Only take over-the-counter or prescription medicines as directed by your health care provider. °· Do not take laxatives unless directed to do so by your health care provider. °· Try a clear liquid diet (broth, tea, or water) as directed by your health care provider. Slowly move to a bland diet as tolerated. °SEEK MEDICAL CARE IF: °· You have unexplained abdominal pain. °· You have abdominal pain associated with nausea or diarrhea. °· You have pain when you urinate or have a bowel movement. °· You experience abdominal pain that wakes you in the night. °· You have abdominal pain that is worsened or improved by eating food. °· You have abdominal pain that is worsened with eating fatty foods. °· You have a fever. °SEEK IMMEDIATE MEDICAL CARE IF:  °· Your pain does not go away within 2 hours. °· You keep throwing up (vomiting). °· Your pain is felt only in portions of the abdomen, such as the right side or the left lower portion of the abdomen. °· You pass bloody or black tarry stools. °MAKE SURE YOU: °· Understand these instructions.   °· Will watch your condition.   °· Will get help right away if you are not doing well or get worse.   °Document Released: 07/27/2005 Document Revised: 10/22/2013 Document Reviewed: 06/26/2013 °ExitCare® Patient Information  ©2015 [employer], [employer]. This information is not intended to replace advice given to you by your health care provider. Make sure you discuss any questions you have with your health care provider. ° °Hypertension °Hypertension, commonly called high blood pressure, is when the force of blood pumping through your arteries is too strong. Your arteries are the blood vessels that carry blood from your heart throughout your body. A blood pressure reading consists of a higher number over a lower number, such as 110/72. The higher number (systolic) is the pressure inside your arteries when your heart pumps. The lower number (diastolic) is the pressure inside your arteries when your heart relaxes. Ideally you want your blood pressure below 120/80. °Hypertension forces your heart to work harder to pump blood. Your arteries may become narrow or stiff. Having hypertension puts you at risk for heart disease, stroke, and other problems.  °RISK FACTORS °Some risk factors for high blood pressure are controllable. Others are not.  °Risk factors you cannot control include:  °· Race. You may be at higher risk if you are African American. °· Age. Risk increases with age. °· Gender. Men are at higher risk than women before age 45 years. After age 65, women are at higher risk than men. °Risk factors you can control include: °· Not getting enough exercise or physical activity. °· Being overweight. °· Getting too much fat, sugar, calories, or salt in your diet. °· Drinking too much alcohol. °SIGNS AND SYMPTOMS °Hypertension does not usually cause signs or symptoms. Extremely high   blood pressure (hypertensive crisis) may cause headache, anxiety, shortness of breath, and nosebleed. °DIAGNOSIS  °To check if you have hypertension, your health care provider will measure your blood pressure while you are seated, with your arm held at the level of your heart. It should be measured at least twice using the same arm. Certain conditions can cause a  difference in blood pressure between your right and left arms. A blood pressure reading that is higher than normal on one occasion does not mean that you need treatment. If one blood pressure reading is high, ask your health care provider about having it checked again. °TREATMENT  °Treating high blood pressure includes making lifestyle changes and possibly taking medicine. Living a healthy lifestyle can help lower high blood pressure. You may need to change some of your habits. °Lifestyle changes may include: °· Following the DASH diet. This diet is high in fruits, vegetables, and whole grains. It is low in salt, red meat, and added sugars. °· Getting at least 2½ hours of brisk physical activity every week. °· Losing weight if necessary. °· Not smoking. °· Limiting alcoholic beverages. °· Learning ways to reduce stress. ° If lifestyle changes are not enough to get your blood pressure under control, your health care provider may prescribe medicine. You may need to take more than one. Work closely with your health care provider to understand the risks and benefits. °HOME CARE INSTRUCTIONS °· Have your blood pressure rechecked as directed by your health care provider.   °· Take medicines only as directed by your health care provider. Follow the directions carefully. Blood pressure medicines must be taken as prescribed. The medicine does not work as well when you skip doses. Skipping doses also puts you at risk for problems.   °· Do not smoke.   °· Monitor your blood pressure at home as directed by your health care provider.  °SEEK MEDICAL CARE IF:  °· You think you are having a reaction to medicines taken. °· You have recurrent headaches or feel dizzy. °· You have swelling in your ankles. °· You have trouble with your vision. °SEEK IMMEDIATE MEDICAL CARE IF: °· You develop a severe headache or confusion. °· You have unusual weakness, numbness, or feel faint. °· You have severe chest or abdominal pain. °· You vomit  repeatedly. °· You have trouble breathing. °MAKE SURE YOU:  °· Understand these instructions. °· Will watch your condition. °· Will get help right away if you are not doing well or get worse. °Document Released: 10/17/2005 Document Revised: 03/03/2014 Document Reviewed: 08/09/2013 °ExitCare® Patient Information ©2015 [employer], [employer]. This information is not intended to replace advice given to you by your health care provider. Make sure you discuss any questions you have with your health care provider. ° °

## 2014-08-29 ENCOUNTER — Encounter (HOSPITAL_COMMUNITY): Payer: Self-pay | Admitting: Emergency Medicine

## 2014-08-29 ENCOUNTER — Emergency Department (HOSPITAL_COMMUNITY): Payer: PRIVATE HEALTH INSURANCE

## 2014-08-29 ENCOUNTER — Emergency Department (HOSPITAL_COMMUNITY)
Admission: EM | Admit: 2014-08-29 | Discharge: 2014-08-29 | Disposition: A | Discharge status: Home / Self Care | Payer: PRIVATE HEALTH INSURANCE | Attending: Emergency Medicine | Admitting: Emergency Medicine

## 2014-08-29 DIAGNOSIS — Z7982 Long term (current) use of aspirin: Secondary | ICD-10-CM | POA: Diagnosis not present

## 2014-08-29 DIAGNOSIS — Z792 Long term (current) use of antibiotics: Secondary | ICD-10-CM | POA: Diagnosis not present

## 2014-08-29 DIAGNOSIS — Z79899 Other long term (current) drug therapy: Secondary | ICD-10-CM | POA: Diagnosis not present

## 2014-08-29 DIAGNOSIS — E119 Type 2 diabetes mellitus without complications: Secondary | ICD-10-CM | POA: Insufficient documentation

## 2014-08-29 DIAGNOSIS — I10 Essential (primary) hypertension: Secondary | ICD-10-CM | POA: Diagnosis not present

## 2014-08-29 DIAGNOSIS — Z872 Personal history of diseases of the skin and subcutaneous tissue: Secondary | ICD-10-CM | POA: Insufficient documentation

## 2014-08-29 DIAGNOSIS — F0390 Unspecified dementia without behavioral disturbance: Secondary | ICD-10-CM | POA: Insufficient documentation

## 2014-08-29 DIAGNOSIS — R109 Unspecified abdominal pain: Secondary | ICD-10-CM | POA: Insufficient documentation

## 2014-08-29 DIAGNOSIS — R079 Chest pain, unspecified: Secondary | ICD-10-CM

## 2014-08-29 DIAGNOSIS — E785 Hyperlipidemia, unspecified: Secondary | ICD-10-CM | POA: Diagnosis not present

## 2014-08-29 DIAGNOSIS — J189 Pneumonia, unspecified organism: Secondary | ICD-10-CM

## 2014-08-29 DIAGNOSIS — J159 Unspecified bacterial pneumonia: Secondary | ICD-10-CM | POA: Diagnosis not present

## 2014-08-29 DIAGNOSIS — Z87448 Personal history of other diseases of urinary system: Secondary | ICD-10-CM | POA: Diagnosis not present

## 2014-08-29 LAB — CBC WITH DIFFERENTIAL/PLATELET
BASOS ABS: 0 10*3/uL (ref 0.0–0.1)
Basophils Relative: 1 % (ref 0–1)
Eosinophils Absolute: 0 10*3/uL (ref 0.0–0.7)
Eosinophils Relative: 1 % (ref 0–5)
HCT: 35.4 % — ABNORMAL LOW (ref 36.0–46.0)
HEMOGLOBIN: 11.7 g/dL — AB (ref 12.0–15.0)
LYMPHS PCT: 29 % (ref 12–46)
Lymphs Abs: 1.6 10*3/uL (ref 0.7–4.0)
MCH: 30 pg (ref 26.0–34.0)
MCHC: 33.1 g/dL (ref 30.0–36.0)
MCV: 90.8 fL (ref 78.0–100.0)
Monocytes Absolute: 0.3 10*3/uL (ref 0.1–1.0)
Monocytes Relative: 5 % (ref 3–12)
NEUTROS ABS: 3.5 10*3/uL (ref 1.7–7.7)
Neutrophils Relative %: 64 % (ref 43–77)
Platelets: 217 10*3/uL (ref 150–400)
RBC: 3.9 MIL/uL (ref 3.87–5.11)
RDW: 12.2 % (ref 11.5–15.5)
WBC: 5.4 10*3/uL (ref 4.0–10.5)

## 2014-08-29 LAB — BASIC METABOLIC PANEL
ANION GAP: 12 (ref 5–15)
BUN: 17 mg/dL (ref 6–23)
CHLORIDE: 102 meq/L (ref 96–112)
CO2: 27 meq/L (ref 19–32)
CREATININE: 0.96 mg/dL (ref 0.50–1.10)
Calcium: 9.9 mg/dL (ref 8.4–10.5)
GFR calc Af Amer: 59 mL/min — ABNORMAL LOW (ref >90)
GFR calc non Af Amer: 51 mL/min — ABNORMAL LOW (ref >90)
GLUCOSE: 97 mg/dL (ref 70–99)
Potassium: 4.2 mEq/L (ref 3.7–5.3)
Sodium: 141 mEq/L (ref 137–147)

## 2014-08-29 LAB — URINALYSIS, ROUTINE W REFLEX MICROSCOPIC
Bilirubin Urine: NEGATIVE
Glucose, UA: NEGATIVE mg/dL
Hgb urine dipstick: NEGATIVE
Ketones, ur: NEGATIVE mg/dL
LEUKOCYTES UA: NEGATIVE
Nitrite: NEGATIVE
PH: 7.5 (ref 5.0–8.0)
Protein, ur: NEGATIVE mg/dL
SPECIFIC GRAVITY, URINE: 1.009 (ref 1.005–1.030)
Urobilinogen, UA: 0.2 mg/dL (ref 0.0–1.0)

## 2014-08-29 MED ORDER — DOXYCYCLINE HYCLATE 100 MG PO CAPS
100.0000 mg | ORAL_CAPSULE | Freq: Two times a day (BID) | ORAL | Status: DC
Start: 2014-08-29 — End: 2015-07-14

## 2014-08-29 NOTE — Discharge Instructions (Signed)

## 2014-08-29 NOTE — ED Provider Notes (Signed)
CSN: [id number]     Arrival date & time 08/29/14  [id number] History   First MD Initiated Contact with Patient 08/29/14 0848     Chief Complaint  Patient presents with  . Chest Pain  . Abdominal Pain     (Consider location/radiation/quality/duration/timing/severity/associated sxs/prior Treatment) HPI  Gloria Velasquez is a 80 y.o. female presents for evaluation of reported chest pain and abdominal pain. Patient cannot contribute to history. Information gained from nursing notes, at her nursing care facility.  Level V Caveat   Past Medical History  Diagnosis Date  . Microalbuminuria   . Pyuria   . Dermatitis   . Dementia   . Diabetes mellitus without complication   . Hyperlipidemia   . Hypertension    History reviewed. No pertinent past surgical history. History reviewed. No pertinent family history. History  Substance Use Topics  . Smoking status: Never Smoker   . Smokeless tobacco: Not on file  . Alcohol Use: No   OB History   Grav Para Term Preterm Abortions TAB SAB Ect Mult Living                 Review of Systems  Unable to perform ROS     Allergies  Review of patient's allergies indicates no known allergies.  Home Medications   Prior to Admission medications   Medication Sig Start Date End Date Taking? Authorizing Provider  amLODipine (NORVASC) 5 MG tablet Take 5 mg by mouth daily.   Yes Historical Provider, MD  aspirin EC 81 MG tablet Take 81 mg by mouth daily.   Yes Historical Provider, MD  atorvastatin (LIPITOR) 10 MG tablet Take 10 mg by mouth daily.   Yes Historical Provider, MD  donepezil (ARICEPT) 10 MG tablet Take 10 mg by mouth at bedtime.   Yes Historical Provider, MD  ferrous sulfate 325 (65 FE) MG tablet Take 325 mg by mouth daily with breakfast.   Yes Historical Provider, MD  glyBURIDE (DIABETA) 5 MG tablet Take 5 mg by mouth 2 (two) times daily with a meal.   Yes Historical Provider, MD  metFORMIN (GLUCOPHAGE) 1000 MG tablet Take 1,000 mg by  mouth 2 (two) times daily with a meal.   Yes Historical Provider, MD  OVER THE COUNTER MEDICATION Take 1 each by mouth 3 (three) times daily with meals. Great Shakes   Yes Historical Provider, MD  senna (SENOKOT) 8.6 MG TABS tablet Take 1 tablet by mouth daily.   Yes Historical Provider, MD  doxycycline (VIBRAMYCIN) 100 MG capsule Take 1 capsule (100 mg total) by mouth 2 (two) times daily. 08/29/14   Flint MelterElliott L Jaylena Holloway, MD   BP 187/82  Pulse 91  Temp(Src) 99 F (37.2 C) (Oral)  Resp 20  SpO2 95% Physical Exam  Nursing note and vitals reviewed. Constitutional: She appears well-developed.  Elderly, frail  HENT:  Head: Normocephalic and atraumatic.  Right Ear: External ear normal.  Left Ear: External ear normal.  Severe dental plaque. Mucous membranes are moist  Eyes: Conjunctivae and EOM are normal. Pupils are equal, round, and reactive to light.  Neck: Normal range of motion and phonation normal. Neck supple.  Cardiovascular: Normal rate, regular rhythm and normal heart sounds.   Pulmonary/Chest: Effort normal and breath sounds normal. No respiratory distress. She has no wheezes. She exhibits no tenderness and no bony tenderness.  Abdominal: Soft. There is no tenderness.  Musculoskeletal: Normal range of motion.  Neurological: She is alert. No cranial nerve deficit or sensory deficit.  She exhibits normal muscle tone. Coordination normal.  No dysarthria or expressive aphasia  Skin: Skin is warm, dry and intact.  Psychiatric:  Suspicious affect    ED Course  Procedures (including critical care time)  Medications - No data to display  Patient Vitals for the past 24 hrs:  BP Temp Temp src Pulse Resp SpO2  08/29/14 1319 187/82 mmHg - - 91 20 95 %  08/29/14 1212 201/82 mmHg - - 70 16 93 %  08/29/14 0900 186/76 mmHg - - 60 14 100 %  08/29/14 0856 189/109 mmHg 99 F (37.2 C) Oral 63 20 99 %      Labs Review Labs Reviewed  CBC WITH DIFFERENTIAL - Abnormal; Notable for the  following:    Hemoglobin 11.7 (*)    HCT 35.4 (*)    All other components within normal limits  BASIC METABOLIC PANEL - Abnormal; Notable for the following:    GFR calc non Af Amer 51 (*)    GFR calc Af Amer 59 (*)    All other components within normal limits  URINALYSIS, ROUTINE W REFLEX MICROSCOPIC - Abnormal; Notable for the following:    APPearance HAZY (*)    All other components within normal limits  URINE CULTURE    Imaging Review Dg Chest 2 View  08/29/2014   CLINICAL DATA:  Abdominal pain, chest pain for a few days.  EXAM: CHEST  2 VIEW  COMPARISON:  08/19/2011  FINDINGS: Airspace disease in the left lower lobe concerning for pneumonia. Fluid within the fissure on the lateral view. Heart is borderline in size. Mediastinal contours are within normal limits. No acute bony abnormality.  IMPRESSION: Airspace opacity posteriorly on the lateral view, likely within the left lower lobe. Cannot exclude pneumonia.   Electronically Signed   By: Charlett NoseKevin  Dover M.D.   On: 08/29/2014 10:37   Dg Abd 2 Views  08/29/2014   CLINICAL DATA:  Abdominal pain and chest pain.  EXAM: ABDOMEN - 2 VIEW  COMPARISON:  CT 05/10/2013  FINDINGS: Prior cholecystectomy. Nonobstructive bowel gas pattern. No free air. No organomegaly or suspicious calcification. Degenerative changes in the lower lumbar spine and hips.  IMPRESSION: No acute findings in the abdomen/pelvis.   Electronically Signed   By: Charlett NoseKevin  Dover M.D.   On: 08/29/2014 10:38     EKG Interpretation   Date/Time:  Friday August 29 2014 08:55:08 EDT Ventricular Rate:  58 PR Interval:    QRS Duration: 85 QT Interval:  402 QTC Calculation: 395 R Axis:   9 Text Interpretation:  Sinus rhythm Sinus arrhythmia RSR' in V1 or V2,  right VCD or RVH since last tracing no significant change Confirmed by  [facility]  MD, Cobie Marcoux ([id number]) on 08/29/2014 9:09:55 AM      MDM   Final diagnoses:  Chest pain  Abdominal pain  CAP (community acquired pneumonia)    Nonspecific findings, possible pneumonia. No evidence for serious patella, infection, sepsis, metabolic instability or impending vascular collapse.   Nursing Notes Reviewed/ Care Coordinated Applicable Imaging Reviewed Interpretation of Laboratory Data incorporated into ED treatment  The patient appears reasonably screened and/or stabilized for discharge and I doubt any other medical condition or other [facility] requiring further screening, evaluation, or treatment in the ED at this time prior to discharge.  Plan: Home Medications- Doxycycline; Home Treatments- rest; return here if the recommended treatment, does not improve the symptoms; Recommended follow up- PCP 3 days    Flint MelterElliott L Braleigh Massoud, MD 08/31/14 1457

## 2014-08-29 NOTE — ED Notes (Signed)
[facility] Pt has dementia, reported generalized chest pain to staff. On arrival to ed, pt reports abd pain and "just wanting to be left alone."

## 2014-08-29 NOTE — ED Notes (Signed)
Patient transported to X-ray 

## 2014-09-01 LAB — URINE CULTURE

## 2014-09-02 ENCOUNTER — Telehealth (HOSPITAL_BASED_OUTPATIENT_CLINIC_OR_DEPARTMENT_OTHER): Payer: Self-pay | Admitting: Emergency Medicine

## 2014-09-02 NOTE — Telephone Encounter (Signed)
Post ED Visit - Positive Culture Follow-up  Culture report reviewed by antimicrobial stewardship pharmacist: [x]  Wes Dulaney, Pharm.D., BCPS []  Celedonio MiyamotoJeremy Frens, Pharm.D., BCPS []  Georgina PillionElizabeth Martin, Pharm.D., BCPS []  [place]h Pham, [address]Ph[address]D., BCPS, AAHIVP []  Estella HuskMichelle Turner, Pharm.D., BCPS, AAHIVP []  Carly Sabat, Pharm.D. []  Enzo BiNathan Batchelder, [address]Pharm[address]  Positive urine culture  E. Coli Treated with doxycycline 100mg  po bid x 10 days, organism sensitive to the same and no further patient follow-up is required at this time.  [facility], fax # [phone]  Berle MullMiller, Berania Peedin 09/02/2014, 10:54 AM

## 2014-09-02 NOTE — Progress Notes (Signed)
ED Antimicrobial Stewardship Positive Culture Follow Up   Gloria Velasquez is an 80 y.o. female who [facility] with a chief complaint of  Chief Complaint  Patient presents with  . Chest Pain  . Abdominal Pain    Recent Results (from the past 720 hour(s))  Urine culture     Status: None   Collection Time: 08/29/14 11:01 AM  Result Value Ref Range Status   Specimen Description URINE, RANDOM  Final   Special Requests NONE  Final   Culture  Setup Time   Final    08/29/2014 18:23 Performed at [facility]    Colony Count   Final    >=100,000 COLONIES/ML [facility]    Culture   Final    ESCHERICHIA COLI Performed at [facility]    Report Status 09/01/2014 FINAL  Final   Organism ID, Bacteria ESCHERICHIA COLI  Final      Susceptibility   Escherichia coli - MIC*    AMPICILLIN <=2 SENSITIVE Sensitive     CEFAZOLIN <=4 SENSITIVE Sensitive     CEFTRIAXONE <=1 SENSITIVE Sensitive     CIPROFLOXACIN <=0.25 SENSITIVE Sensitive     GENTAMICIN <=1 SENSITIVE Sensitive     LEVOFLOXACIN <=0.12 SENSITIVE Sensitive     NITROFURANTOIN <=16 SENSITIVE Sensitive     TOBRAMYCIN <=1 SENSITIVE Sensitive     TRIMETH/SULFA <=20 SENSITIVE Sensitive     PIP/TAZO <=4 SENSITIVE Sensitive     * ESCHERICHIA COLI    89yof presented to ED with CP and abdominal pain. Patient received Doxycycline for possible CAP. Urine culture reports Ecoli but UA was negative and no urinary symptoms were reported. Doxycycline should not be used for UTI treatment - please fax culture results to patient's facility [facility] and recommend to repeat UA and culture if clinically warranted.  ED Provider: Emilia BeckKaitlyn Szekalski, PA-C   Gloria DewDulaney, Gloria Velasquez 09/02/2014, 3:49 PM Infectious Diseases Pharmacist Phone# [phone]

## 2014-09-26 ENCOUNTER — Encounter (HOSPITAL_COMMUNITY): Payer: Self-pay

## 2014-09-26 ENCOUNTER — Emergency Department (HOSPITAL_COMMUNITY)
Admission: EM | Admit: 2014-09-26 | Discharge: 2014-09-26 | Disposition: A | Discharge status: Home / Self Care | Payer: PRIVATE HEALTH INSURANCE | Attending: Emergency Medicine | Admitting: Emergency Medicine

## 2014-09-26 ENCOUNTER — Emergency Department (HOSPITAL_COMMUNITY): Payer: PRIVATE HEALTH INSURANCE

## 2014-09-26 DIAGNOSIS — R1084 Generalized abdominal pain: Secondary | ICD-10-CM | POA: Insufficient documentation

## 2014-09-26 DIAGNOSIS — F0390 Unspecified dementia without behavioral disturbance: Secondary | ICD-10-CM | POA: Diagnosis not present

## 2014-09-26 DIAGNOSIS — R112 Nausea with vomiting, unspecified: Secondary | ICD-10-CM

## 2014-09-26 DIAGNOSIS — Z872 Personal history of diseases of the skin and subcutaneous tissue: Secondary | ICD-10-CM | POA: Insufficient documentation

## 2014-09-26 DIAGNOSIS — R197 Diarrhea, unspecified: Secondary | ICD-10-CM | POA: Diagnosis present

## 2014-09-26 DIAGNOSIS — R630 Anorexia: Secondary | ICD-10-CM

## 2014-09-26 DIAGNOSIS — E785 Hyperlipidemia, unspecified: Secondary | ICD-10-CM | POA: Insufficient documentation

## 2014-09-26 DIAGNOSIS — Z87448 Personal history of other diseases of urinary system: Secondary | ICD-10-CM | POA: Diagnosis not present

## 2014-09-26 DIAGNOSIS — R109 Unspecified abdominal pain: Secondary | ICD-10-CM

## 2014-09-26 DIAGNOSIS — Z79899 Other long term (current) drug therapy: Secondary | ICD-10-CM | POA: Diagnosis not present

## 2014-09-26 DIAGNOSIS — Z792 Long term (current) use of antibiotics: Secondary | ICD-10-CM | POA: Insufficient documentation

## 2014-09-26 DIAGNOSIS — I10 Essential (primary) hypertension: Secondary | ICD-10-CM | POA: Diagnosis not present

## 2014-09-26 DIAGNOSIS — E119 Type 2 diabetes mellitus without complications: Secondary | ICD-10-CM | POA: Diagnosis not present

## 2014-09-26 DIAGNOSIS — Z7982 Long term (current) use of aspirin: Secondary | ICD-10-CM | POA: Diagnosis not present

## 2014-09-26 LAB — URINALYSIS, ROUTINE W REFLEX MICROSCOPIC
BILIRUBIN URINE: NEGATIVE
Glucose, UA: NEGATIVE mg/dL
Hgb urine dipstick: NEGATIVE
Ketones, ur: NEGATIVE mg/dL
NITRITE: NEGATIVE
PROTEIN: NEGATIVE mg/dL
Specific Gravity, Urine: 1.031 — ABNORMAL HIGH (ref 1.005–1.030)
UROBILINOGEN UA: 0.2 mg/dL (ref 0.0–1.0)
pH: 6 (ref 5.0–8.0)

## 2014-09-26 LAB — URINE MICROSCOPIC-ADD ON

## 2014-09-26 LAB — COMPREHENSIVE METABOLIC PANEL
ALT: 16 U/L (ref 0–35)
ANION GAP: 15 (ref 5–15)
AST: 22 U/L (ref 0–37)
Albumin: 3.8 g/dL (ref 3.5–5.2)
Alkaline Phosphatase: 72 U/L (ref 39–117)
BILIRUBIN TOTAL: 0.5 mg/dL (ref 0.3–1.2)
BUN: 22 mg/dL (ref 6–23)
CHLORIDE: 103 meq/L (ref 96–112)
CO2: 25 mEq/L (ref 19–32)
CREATININE: 0.91 mg/dL (ref 0.50–1.10)
Calcium: 9.8 mg/dL (ref 8.4–10.5)
GFR calc Af Amer: 62 mL/min — ABNORMAL LOW (ref >90)
GFR calc non Af Amer: 54 mL/min — ABNORMAL LOW (ref >90)
Glucose, Bld: 93 mg/dL (ref 70–99)
Potassium: 3.9 mEq/L (ref 3.7–5.3)
Sodium: 143 mEq/L (ref 137–147)
TOTAL PROTEIN: 7.6 g/dL (ref 6.0–8.3)

## 2014-09-26 LAB — CBC WITH DIFFERENTIAL/PLATELET
BASOS PCT: 0 % (ref 0–1)
Basophils Absolute: 0 10*3/uL (ref 0.0–0.1)
EOS ABS: 0.2 10*3/uL (ref 0.0–0.7)
Eosinophils Relative: 4 % (ref 0–5)
HEMATOCRIT: 41.5 % (ref 36.0–46.0)
Hemoglobin: 13.3 g/dL (ref 12.0–15.0)
Lymphocytes Relative: 21 % (ref 12–46)
Lymphs Abs: 1.2 10*3/uL (ref 0.7–4.0)
MCH: 29.4 pg (ref 26.0–34.0)
MCHC: 32 g/dL (ref 30.0–36.0)
MCV: 91.6 fL (ref 78.0–100.0)
MONO ABS: 0.4 10*3/uL (ref 0.1–1.0)
MONOS PCT: 7 % (ref 3–12)
Neutro Abs: 3.9 10*3/uL (ref 1.7–7.7)
Neutrophils Relative %: 68 % (ref 43–77)
Platelets: 226 10*3/uL (ref 150–400)
RBC: 4.53 MIL/uL (ref 3.87–5.11)
RDW: 12.5 % (ref 11.5–15.5)
WBC: 5.8 10*3/uL (ref 4.0–10.5)

## 2014-09-26 LAB — TROPONIN I: Troponin I: <0.3 ng/mL (ref <0.30)

## 2014-09-26 LAB — LIPASE, BLOOD: LIPASE: 12 U/L (ref 11–59)

## 2014-09-26 LAB — CBG MONITORING, ED
GLUCOSE-CAPILLARY: 69 mg/dL — AB (ref 70–99)
Glucose-Capillary: 48 mg/dL — ABNORMAL LOW (ref 70–99)
Glucose-Capillary: 90 mg/dL (ref 70–99)

## 2014-09-26 MED ORDER — IOHEXOL 300 MG/ML  SOLN
80.0000 mL | Freq: Once | INTRAMUSCULAR | Status: AC | PRN
Start: 2014-09-26 — End: 2014-09-26
  Administered 2014-09-26: 80 mL via INTRAVENOUS

## 2014-09-26 MED ORDER — SODIUM CHLORIDE 0.9 % IV BOLUS (SEPSIS)
1000.0000 mL | Freq: Once | INTRAVENOUS | Status: AC
  Administered 2014-09-26: 1000 mL via INTRAVENOUS

## 2014-09-26 MED ORDER — IOHEXOL 300 MG/ML  SOLN
25.0000 mL | Freq: Once | INTRAMUSCULAR | Status: AC | PRN
  Administered 2014-09-26: 25 mL via ORAL

## 2014-09-26 MED ORDER — METRONIDAZOLE 500 MG PO TABS: 500.0000 mg | ORAL_TABLET | Freq: Two times a day (BID) | ORAL | Status: DC

## 2014-09-26 MED ORDER — SODIUM CHLORIDE 0.9 % IV BOLUS (SEPSIS)
1000.0000 mL | Freq: Once | INTRAVENOUS | Status: AC
Start: 2014-09-26 — End: 2014-09-26
  Administered 2014-09-26: 1000 mL via INTRAVENOUS

## 2014-09-26 MED ORDER — IOHEXOL 300 MG/ML  SOLN: 25.0000 mL | INTRAMUSCULAR | Status: DC | PRN

## 2014-09-26 MED ORDER — CIPROFLOXACIN HCL 500 MG PO TABS: 500.0000 mg | ORAL_TABLET | Freq: Two times a day (BID) | ORAL | Status: DC

## 2014-09-26 MED ORDER — ONDANSETRON 4 MG PO TBDP: 4.0000 mg | ORAL_TABLET | Freq: Three times a day (TID) | ORAL | Status: DC | PRN

## 2014-09-26 NOTE — ED Provider Notes (Signed)
Pt signed out to me for PO challenge. Patient in the ED for emesis, diarrhea.                 CT shows some non specific findings of inflammation.  Stool studies not ordered. Pt to be treated as colitis and discharged if she tolerates po. Pt had low CBG. Apple sauce and juice given. CBG x 2 done. All normal. Abd exam is non surgical  Stable for d/c.  Derwood KaplanAnkit Margie Brink, MD 09/26/14 [phone]

## 2014-09-26 NOTE — Discharge Instructions (Signed)
We saw you in the ER for the abdominal pain. THE RESULTS HERE SHOW SOME SWELLING OF THE COLON/INTESTINES. We are concerned that you might have a mild infection, and antibiotics have been prescribed. We recommend that you see your primary care doctor within 2-3 days for further evaluation. If your symptoms get worse, return to the ER. Take the pain meds and nausea meds as prescribed.  Diverticulitis Diverticulitis is inflammation or infection of small pouches in your colon that form when you have a condition called diverticulosis. The pouches in your colon are called diverticula. Your colon, or large intestine, is where water is absorbed and stool is formed. Complications of diverticulitis can include:  Bleeding.  Severe infection.  Severe pain.  Perforation of your colon.  Obstruction of your colon. CAUSES  Diverticulitis is caused by bacteria. Diverticulitis happens when stool becomes trapped in diverticula. This allows bacteria to grow in the diverticula, which can lead to inflammation and infection. RISK FACTORS People with diverticulosis are at risk for diverticulitis. Eating a diet that does not include enough fiber from fruits and vegetables may make diverticulitis more likely to develop. SYMPTOMS  Symptoms of diverticulitis may include:  Abdominal pain and tenderness. The pain is normally located on the left side of the abdomen, but may occur in other areas.  Fever and chills.  Bloating.  Cramping.  Nausea.  Vomiting.  Constipation.  Diarrhea.  Blood in your stool. DIAGNOSIS  Your health care provider will ask you about your medical history and do a physical exam. You may need to have tests done because many medical conditions can cause the same symptoms as diverticulitis. Tests may include:  Blood tests.  Urine tests.  Imaging tests of the abdomen, including X-rays and CT scans. When your condition is under control, your health care provider may recommend  that you have a colonoscopy. A colonoscopy can show how severe your diverticula are and whether something else is causing your symptoms. TREATMENT  Most cases of diverticulitis are mild and can be treated at home. Treatment may include:  Taking over-the-counter pain medicines.  Following a clear liquid diet.  Taking antibiotic medicines by mouth for 7-10 days. More severe cases may be treated at a hospital. Treatment may include:  Not eating or drinking.  Taking prescription pain medicine.  Receiving antibiotic medicines through an IV tube.  Receiving fluids and nutrition through an IV tube.  Surgery. HOME CARE INSTRUCTIONS   Follow your health care provider's instructions carefully.  Follow a full liquid diet or other diet as directed by your health care provider. After your symptoms improve, your health care provider may tell you to change your diet. He or she may recommend you eat a high-fiber diet. Fruits and vegetables are good sources of fiber. Fiber makes it easier to pass stool.  Take fiber supplements or probiotics as directed by your health care provider.  Only take medicines as directed by your health care provider.  Keep all your follow-up appointments. SEEK MEDICAL CARE IF:   Your pain does not improve.  You have a hard time eating food.  Your bowel movements do not return to normal. SEEK IMMEDIATE MEDICAL CARE IF:   Your pain becomes worse.  Your symptoms do not get better.  Your symptoms suddenly get worse.  You have a fever.  You have repeated vomiting.  You have bloody or black, tarry stools. MAKE SURE YOU:   Understand these instructions.  Will watch your condition.  Will get help right  away if you are not doing well or get worse. Document Released: 07/27/2005 Document Revised: 10/22/2013 Document Reviewed: 09/11/2013 [facility] Patient Information 2015 [place], [place]. This information is not intended to replace advice given to you by your  health care provider. Make sure you discuss any questions you have with your health care provider. Colitis Colitis is inflammation of the colon. Colitis can be a short-term or long-standing (chronic) illness. Crohn's disease and ulcerative colitis are 2 types of colitis which are chronic. They usually require lifelong treatment. CAUSES  There are many different causes of colitis, including:  Viruses.  Germs (bacteria).  Medicine reactions. SYMPTOMS   Diarrhea.  Intestinal bleeding.  Pain.  Fever.  Throwing up (vomiting).  Tiredness (fatigue).  Weight loss.  Bowel blockage. DIAGNOSIS  The diagnosis of colitis is based on examination and stool or blood tests. X-rays, CT scan, and colonoscopy may also be needed. TREATMENT  Treatment may include:  Fluids given through the vein (intravenously).  Bowel rest (nothing to eat or drink for a period of time).  Medicine for pain and diarrhea.  Medicines (antibiotics) that kill germs.  Cortisone medicines.  Surgery. HOME CARE INSTRUCTIONS   Get plenty of rest.  Drink enough water and fluids to keep your urine clear or pale yellow.  Eat a well-balanced diet.  Call your caregiver for follow-up as recommended. SEEK IMMEDIATE MEDICAL CARE IF:   You develop chills.  You have an oral temperature above 102 F (38.9 C), not controlled by medicine.  You have extreme weakness, fainting, or dehydration.  You have repeated vomiting.  You develop severe belly (abdominal) pain or are passing bloody or tarry stools. MAKE SURE YOU:   Understand these instructions.  Will watch your condition.  Will get help right away if you are not doing well or get worse. Document Released: 11/24/2004 Document Revised: 01/09/2012 Document Reviewed: 02/19/2010 [facility] Patient Information 2015 [place], [place]. This information is not intended to replace advice given to you by your health care provider. Make sure you discuss any questions  you have with your health care provider.

## 2014-09-26 NOTE — ED Notes (Signed)
CBG of 48, pt eating apple sauce and given apple juice.

## 2014-09-26 NOTE — ED Notes (Signed)
[facility], spoke with [place]. [facility] called.

## 2014-09-26 NOTE — ED Provider Notes (Signed)
CSN: [id number]     Arrival date & time 09/26/14  1041 History   First MD Initiated Contact with Patient 09/26/14 1046     Chief Complaint  Patient presents with  . Emesis  . Diarrhea     (Consider location/radiation/quality/duration/timing/severity/associated sxs/prior Treatment) Patient is a 80 y.o. female presenting with vomiting and diarrhea. The history is provided by the patient and the EMS personnel. No language interpreter was used.  Emesis Associated symptoms: diarrhea   Diarrhea Associated symptoms: vomiting   Gloria Velasquez is a 80 y/o F with PMHx of microalbuminuria, pyuria, dermatitis, dementia, diabetes, hyperlipidemia, hypertension presenting to the ED from [facility] assisted living regarding nausea, vomiting, diarrhea that has been ongoing intermittently for the past 3 days as per EMS personnel report. As per staff at [facility] assisted living patient is not been eating for the past 2 days. When this provider interviewed the patient patient reported that she is sleepy when asked she is eating well patient reported that "I don't want nothing to eat." ROS limited secondary to dementia - no family at bedside.  Level 5 caveat   Past Medical History  Diagnosis Date  . Microalbuminuria   . Pyuria   . Dermatitis   . Dementia   . Diabetes mellitus without complication   . Hyperlipidemia   . Hypertension    History reviewed. No pertinent past surgical history. History reviewed. No pertinent family history. History  Substance Use Topics  . Smoking status: Never Smoker   . Smokeless tobacco: Not on file  . Alcohol Use: No   OB History    No data available     Review of Systems  Unable to perform ROS: Dementia  Gastrointestinal: Positive for vomiting and diarrhea.      Allergies  Review of patient's allergies indicates no known allergies.  Home Medications   Prior to Admission medications   Medication Sig Start Date End Date Taking? Authorizing Provider   acetaminophen (TYLENOL) 325 MG tablet Take 650 mg by mouth every 6 (six) hours as needed for mild pain or moderate pain.   Yes Historical Provider, MD  amLODipine (NORVASC) 5 MG tablet Take 5 mg by mouth daily.   Yes Historical Provider, MD  aspirin EC 81 MG tablet Take 81 mg by mouth daily.   Yes Historical Provider, MD  atorvastatin (LIPITOR) 10 MG tablet Take 10 mg by mouth daily.   Yes Historical Provider, MD  donepezil (ARICEPT) 10 MG tablet Take 10 mg by mouth at bedtime.   Yes Historical Provider, MD  ferrous sulfate 325 (65 FE) MG tablet Take 325 mg by mouth daily with breakfast.   Yes Historical Provider, MD  glyBURIDE (DIABETA) 5 MG tablet Take 5 mg by mouth 2 (two) times daily with a meal.   Yes Historical Provider, MD  LORazepam (ATIVAN) 0.5 MG tablet Take 0.25 mg by mouth every 4 (four) hours as needed (anxiety or agitation).   Yes Historical Provider, MD  metFORMIN (GLUCOPHAGE) 1000 MG tablet Take 1,000 mg by mouth 2 (two) times daily with a meal.   Yes Historical Provider, MD  OVER THE COUNTER MEDICATION Take 1 each by mouth 3 (three) times daily with meals. Great Shakes   Yes Historical Provider, MD  senna (SENOKOT) 8.6 MG TABS tablet Take 1 tablet by mouth daily.   Yes Historical Provider, MD  doxycycline (VIBRAMYCIN) 100 MG capsule Take 1 capsule (100 mg total) by mouth 2 (two) times daily. 08/29/14   Gloria Melter,  MD   BP 156/73 mmHg  Pulse 64  Temp(Src) 97.5 F (36.4 C) (Oral)  Resp 18  SpO2 100% Physical Exam  Constitutional: She appears well-developed and well-nourished. No distress.  HENT:  Head: Normocephalic and atraumatic.  Dry mucus membranes  Eyes: Conjunctivae and EOM are normal. Pupils are equal, round, and reactive to light. Right eye exhibits no discharge. Left eye exhibits no discharge.  Neck: Normal range of motion. Neck supple.  Cardiovascular: Normal rate, regular rhythm and normal heart sounds.  Exam reveals no friction rub.   No murmur  heard. Pulmonary/Chest: Effort normal and breath sounds normal. No respiratory distress. She has no wheezes.  Abdominal: Soft. Bowel sounds are normal. She exhibits no distension. There is generalized tenderness. There is no rebound and no guarding.  Negative abdominal distention Bowel sounds normoactive in all 4 quadrants Abdomen soft upon palpation Discomfort upon palpation to the abdomen generalized with positive voluntary guarding upon palpation Negative rigidity  Musculoskeletal: Normal range of motion.  Neurological: She is alert. No cranial nerve deficit. She exhibits normal muscle tone. Coordination normal.  Skin: Skin is warm and dry. No rash noted. She is not diaphoretic. No erythema.  Psychiatric: She has a normal mood and affect. Her behavior is normal. Thought content normal.  Nursing note and vitals reviewed.   ED Course  Procedures (including critical care time)  Results for orders placed or performed during the hospital encounter of 09/26/14  CBC with Differential  Result Value Ref Range   WBC 5.8 4.0 - 10.5 K/uL   RBC 4.53 3.87 - 5.11 MIL/uL   Hemoglobin 13.3 12.0 - 15.0 g/dL   HCT 16.141.5 09.636.0 - 04.546.0 %   MCV 91.6 78.0 - 100.0 fL   MCH 29.4 26.0 - 34.0 pg   MCHC 32.0 30.0 - 36.0 g/dL   RDW 40.912.5 81.111.5 - 91.415.5 %   Platelets 226 150 - 400 K/uL   Neutrophils Relative % 68 43 - 77 %   Neutro Abs 3.9 1.7 - 7.7 K/uL   Lymphocytes Relative 21 12 - 46 %   Lymphs Abs 1.2 0.7 - 4.0 K/uL   Monocytes Relative 7 3 - 12 %   Monocytes Absolute 0.4 0.1 - 1.0 K/uL   Eosinophils Relative 4 0 - 5 %   Eosinophils Absolute 0.2 0.0 - 0.7 K/uL   Basophils Relative 0 0 - 1 %   Basophils Absolute 0.0 0.0 - 0.1 K/uL  Comprehensive metabolic panel  Result Value Ref Range   Sodium 143 137 - 147 mEq/L   Potassium 3.9 3.7 - 5.3 mEq/L   Chloride 103 96 - 112 mEq/L   CO2 25 19 - 32 mEq/L   Glucose, Bld 93 70 - 99 mg/dL   BUN 22 6 - 23 mg/dL   Creatinine, Ser 7.820.91 0.50 - 1.10 mg/dL    Calcium 9.8 8.4 - 95.610.5 mg/dL   Total Protein 7.6 6.0 - 8.3 g/dL   Albumin 3.8 3.5 - 5.2 g/dL   AST 22 0 - 37 U/L   ALT 16 0 - 35 U/L   Alkaline Phosphatase 72 39 - 117 U/L   Total Bilirubin 0.5 0.3 - 1.2 mg/dL   GFR calc non Af Amer 54 (L) >90 mL/min   GFR calc Af Amer 62 (L) >90 mL/min   Anion gap 15 5 - 15  Lipase, blood  Result Value Ref Range   Lipase 12 11 - 59 U/L  Troponin I  Result Value Ref Range  Troponin I <0.30 <0.30 ng/mL    Labs Review Labs Reviewed  COMPREHENSIVE METABOLIC PANEL - Abnormal; Notable for the following:    GFR calc non Af Amer 54 (*)    GFR calc Af Amer 62 (*)    All other components within normal limits  CBC WITH DIFFERENTIAL  LIPASE, BLOOD  TROPONIN I  URINALYSIS, ROUTINE W REFLEX MICROSCOPIC  TROPONIN I    Imaging Review Ct Abdomen Pelvis W Contrast  09/26/2014   CLINICAL DATA:  80 year old with nausea, vomiting and diarrhea for 3 days.  EXAM: CT ABDOMEN AND PELVIS WITH CONTRAST  TECHNIQUE: Multidetector CT imaging of the abdomen and pelvis was performed using the standard protocol following bolus administration of intravenous contrast.  CONTRAST:  80mL OMNIPAQUE IOHEXOL 300 MG/ML  SOLN  COMPARISON:  05/10/2013  FINDINGS: Lung bases demonstrate mild atelectasis with motion artifact. No evidence for free air.  There are small low-density structures in the liver and these are most compatible with cysts. The largest measures 0.9 cm in the anterior right hepatic lobe. Gallbladder has been removed. No significant biliary dilatation. No gross abnormality to the pancreas, spleen or adrenal glands. 2.6 cm cyst in the right kidney upper pole. At least 2 additional cysts in the right kidney without hydronephrosis. Normal appearance of left kidney. Atherosclerotic calcifications involving the aorta and iliac arteries without aneurysm. No significant free fluid or lymphadenopathy. Uterus has been removed. There is fluid in the urinary bladder.  There is stool in  the rectum and a small amount stool in the sigmoid colon. Mild wall thickening in the the distal colon. This is most prominent in sigmoid colon on series 2, image 75. No evidence for pericolonic inflammation. Mild dilatation of distal small bowel loops but no evidence for a bowel obstruction.  No acute bone abnormality. Multilevel disc disease in the lumbar spine, particularly at L5-S1. No evidence for compression fracture.  IMPRESSION: Mild wall thickening involving the distal colon, particularly the sigmoid colon. There is no significant pericolonic inflammation. These findings could be related to non distended colon but difficult to exclude mild colonic inflammation.  Mild dilatation of distal small bowel loops and right colon. No clear evidence for a colonic obstruction.   Electronically Signed   By: Richarda OverlieAdam  Henn M.D.   On: 09/26/2014 16:17     EKG Interpretation   Date/Time:  Friday September 26 2014 11:12:31 EST Ventricular Rate:  59 PR Interval:  165 QRS Duration: 90 QT Interval:  430 QTC Calculation: 426 R Axis:   3 Text Interpretation:  Sinus rhythm RSR' in V1 or V2, probably normal  variant Baseline wander in lead(s) V6 since last tracing no significant  change Confirmed by BELFI  MD, MELANIE ([id number]) on 09/26/2014 3:31:21 PM      MDM   Final diagnoses:  Abdominal pain  Non-intractable vomiting with nausea, vomiting of unspecified type  Anorexia   Medications  iohexol (OMNIPAQUE) 300 MG/ML solution 25 mL (not administered)  sodium chloride 0.9 % bolus 1,000 mL (0 mLs Intravenous Stopped 09/26/14 1315)  iohexol (OMNIPAQUE) 300 MG/ML solution 25 mL (25 mLs Oral Contrast Given 09/26/14 1248)  sodium chloride 0.9 % bolus 1,000 mL (1,000 mLs Intravenous New Bag/Given 09/26/14 1601)  iohexol (OMNIPAQUE) 300 MG/ML solution 80 mL (80 mLs Intravenous Contrast Given 09/26/14 1542)   Filed Vitals:   09/26/14 1330 09/26/14 1345 09/26/14 1400 09/26/14 1611  BP: 141/90 152/98 129/87 156/73   Pulse: 69 63 69 64  Temp:  TempSrc:      Resp: 15 18 16 18   SpO2: 100% 100% 95% 100%   EKG sinus rhythm with a heart rate of 59 bpm. Troponin negative elevation. CBC negative elevated white blood cell counts. Hemoglobin 13.3, hematocrit 41.5. CMP unremarkable-negative elevated BUN/creatinine. Glucose 93 was negative elevated anion gap. Lipase negative elevation. CT abdomen and pelvis with contrast noted mild wall thickening involving the distal colon particularly in the sigmoid: With no significant pericolonic inflammation. Findings could be related to nondistended colon but difficult to exclude mild colonic inflammation. Mild dilatation of distal small bowel loops; no clear evidence of colonic obstruction. Doubt appendicitis. Doubt pancreatitis. Doubt DKA - glucose 93 with negative elevated anion gap or bicarb. Negative findings of aneurysm on CT. Patient presenting to the ED with nausea, vomiting, diarrhea for the past 3 days with decrease intake for the past 2 days. Patient not septic appearing. Labs unremarkable - UA and second troponin pending. Patient given IV fluids while in the ED setting.  UA and second troponin pending. Fluid challenge recommended. Discussed case with Dr. Theodoro Grist - labs and imaging reviewed in great detail with Dr. Theodoro Grist. Transfer of care to Dr. Theodoro Grist at change in shift.   Raymon Mutton, PA-C 09/26/14 1702  Raymon Mutton, PA-C 09/26/14 1709  Raymon Mutton, PA-C 09/26/14 1710  Rolan Bucco, MD 09/27/14 [phone]

## 2014-09-26 NOTE — ED Notes (Signed)
Attempted to in and out cath pt, pt uncooperative and holding RN's hand to not allow cleaning of area.  Placed pt on bedpan, pt unable to void despite being told why lab is needed.  Pt able to drink most of PO contrast.

## 2014-09-26 NOTE — ED Notes (Signed)
EDP notified of glucose, will re-check after pt has apple juice.

## 2014-09-26 NOTE — ED Notes (Signed)
Pt back from CT

## 2014-09-26 NOTE — ED Notes (Signed)
Family at bedside, informed of care and results.

## 2014-09-26 NOTE — ED Notes (Signed)
[facility]- [facility], has had N/V/D x3 days. EMS also reports pt hs not been eating at her facility for 2 days. Staff at facility attempted to give pt imodium but pt refused. She is alert and oriented at her baseline, hx of dementia.

## 2014-09-26 NOTE — ED Notes (Signed)
Pt becoming angry and refusing to void or drink contrast.  Pt's daughter is at bedside; pt eventually becomes compliant with instruction and redirection.  Pt attempting to drink contrast at present.

## 2015-07-14 ENCOUNTER — Emergency Department (HOSPITAL_COMMUNITY): Payer: Medicare Other

## 2015-07-14 ENCOUNTER — Emergency Department (HOSPITAL_COMMUNITY)
Admission: EM | Admit: 2015-07-14 | Discharge: 2015-07-14 | Disposition: A | Discharge status: Home / Self Care | Payer: Medicare Other | Attending: Emergency Medicine | Admitting: Emergency Medicine

## 2015-07-14 ENCOUNTER — Encounter (HOSPITAL_COMMUNITY): Payer: Self-pay | Admitting: Emergency Medicine

## 2015-07-14 DIAGNOSIS — E119 Type 2 diabetes mellitus without complications: Secondary | ICD-10-CM | POA: Insufficient documentation

## 2015-07-14 DIAGNOSIS — F0390 Unspecified dementia without behavioral disturbance: Secondary | ICD-10-CM

## 2015-07-14 DIAGNOSIS — E785 Hyperlipidemia, unspecified: Secondary | ICD-10-CM | POA: Diagnosis not present

## 2015-07-14 DIAGNOSIS — S0511XA Contusion of eyeball and orbital tissues, right eye, initial encounter: Secondary | ICD-10-CM | POA: Diagnosis not present

## 2015-07-14 DIAGNOSIS — S0011XA Contusion of right eyelid and periocular area, initial encounter: Secondary | ICD-10-CM

## 2015-07-14 DIAGNOSIS — Z79899 Other long term (current) drug therapy: Secondary | ICD-10-CM | POA: Insufficient documentation

## 2015-07-14 DIAGNOSIS — I10 Essential (primary) hypertension: Secondary | ICD-10-CM | POA: Insufficient documentation

## 2015-07-14 DIAGNOSIS — Z872 Personal history of diseases of the skin and subcutaneous tissue: Secondary | ICD-10-CM | POA: Insufficient documentation

## 2015-07-14 DIAGNOSIS — S0990XA Unspecified injury of head, initial encounter: Secondary | ICD-10-CM | POA: Diagnosis present

## 2015-07-14 DIAGNOSIS — Y9389 Activity, other specified: Secondary | ICD-10-CM | POA: Insufficient documentation

## 2015-07-14 DIAGNOSIS — Y9289 Other specified places as the place of occurrence of the external cause: Secondary | ICD-10-CM | POA: Insufficient documentation

## 2015-07-14 DIAGNOSIS — G309 Alzheimer's disease, unspecified: Secondary | ICD-10-CM | POA: Diagnosis not present

## 2015-07-14 DIAGNOSIS — Y998 Other external cause status: Secondary | ICD-10-CM | POA: Diagnosis not present

## 2015-07-14 DIAGNOSIS — W19XXXA Unspecified fall, initial encounter: Secondary | ICD-10-CM

## 2015-07-14 DIAGNOSIS — Z7982 Long term (current) use of aspirin: Secondary | ICD-10-CM | POA: Insufficient documentation

## 2015-07-14 DIAGNOSIS — Z87448 Personal history of other diseases of urinary system: Secondary | ICD-10-CM | POA: Diagnosis not present

## 2015-07-14 DIAGNOSIS — X58XXXA Exposure to other specified factors, initial encounter: Secondary | ICD-10-CM | POA: Diagnosis not present

## 2015-07-14 DIAGNOSIS — F0280 Dementia in other diseases classified elsewhere without behavioral disturbance: Secondary | ICD-10-CM | POA: Insufficient documentation

## 2015-07-14 NOTE — Discharge Instructions (Signed)
Return without fail for worsening symptoms, including confusion, vomiting and unable to keep down food/fluids, or any other symptoms concerning to you.    Eye Contusion  An eye contusion is a deep bruise of the eye. It is often called a "black eye." Contusions happen when an injury causes bleeding under the skin. Signs of bruising include pain, puffiness (swelling), and discolored skin. The contusion may turn blue, purple, or yellow. A black eye can be very serious and can affect the eyeball and sight. HOME CARE  Put ice on the injured area.  Put ice in a plastic bag.  Place a towel between your skin and the bag.  Leave the ice on for 15-20 minutes, 03-04 times a day.  If there is no injury to the eye, you may keep doing normal activities.  Wear sunglasses if bright lights bother you.  Sleep with your head raised (elevated) to help with discomfort.  Only take medicines as told by your doctor. GET HELP RIGHT AWAY IF:  You notice any vision loss.  You see two of everything (double vision).  You feel sick to your stomach (nauseous).  You feel dizzy, sleepy, or like you will pass out (faint).  You have any fluid coming from your eye or nose.  You have puffiness and bruising that does not fade. MAKE SURE YOU:   Understand these instructions.  Will watch your condition.  Will get help right away if you are not doing well or get worse. Document Released: 10/06/2011 Document Revised: 01/09/2012 Document Reviewed: 10/06/2011 [facility] Patient Information 2015 [place], [place]. This information is not intended to replace advice given to you by your health care provider. Make sure you discuss any questions you have with your health care provider.

## 2015-07-14 NOTE — ED Notes (Signed)
Bed: WA09 Expected date:  Expected time:  Means of arrival:  Comments: EMS 81yo F, hematoma to under rt eye

## 2015-07-14 NOTE — ED Notes (Signed)
Call placed to [facility]( [phone]) regarding DC plan. Instruction reviewed with Ether Griffins case manager. [facility] contacted for transport

## 2015-07-14 NOTE — ED Provider Notes (Signed)
CSN: [id number]     Arrival date & time 07/14/15  0707 History   First MD Initiated Contact with Patient 07/14/15 [phone]     Chief Complaint  Patient presents with  . Fall    89 yof PMHx Alzheimers, HTN, DM, presents via EMS from [facility] after staff found patient on floor. Contusion to right cheek. Pt oriented to person and place only (baseline per EMS). Denies neck pain. Asymptomatic. EMS glucose 106mg /dl.      (Consider location/radiation/quality/duration/timing/severity/associated sxs/prior Treatment) HPI  81 year old female who presents after fall. History of microalbuminuria, dementia (oriented 2 at baseline), diabetes, hypertension, and hyperlipidemia. Presents from [facility] assisted [facility] facility. Staff members reported that she has otherwise been in her usual state of health and went to bed yesterday evening. This morning, the patient was found on the ground. She had pulled the blankets and pillows from her bed and was sleeping with them on the ground. She had notable bruising to her face. EMS was called and brought her to the ED for evaluation. Reports that she has been at her mental status baseline this morning. She denies pain.  Past Medical History  Diagnosis Date  . Microalbuminuria   . Pyuria   . Dermatitis   . Dementia   . Diabetes mellitus without complication   . Hyperlipidemia   . Hypertension    History reviewed. No pertinent past surgical history. History reviewed. No pertinent family history. Social History  Substance Use Topics  . Smoking status: Never Smoker   . Smokeless tobacco: None  . Alcohol Use: No   OB History    No data available     Review of Systems  Unable to perform ROS: Dementia     Allergies  Review of patient's allergies indicates no known allergies.  Home Medications   Prior to Admission medications   Medication Sig Start Date End Date Taking? Authorizing Provider  acetaminophen (TYLENOL) 325 MG tablet Take 650 mg by mouth  every 6 (six) hours as needed for mild pain or moderate pain.   Yes Historical Provider, MD  amLODipine (NORVASC) 5 MG tablet Take 5 mg by mouth daily.   Yes Historical Provider, MD  aspirin EC 81 MG tablet Take 81 mg by mouth daily.   Yes Historical Provider, MD  atorvastatin (LIPITOR) 10 MG tablet Take 5 mg by mouth daily.    Yes Historical Provider, MD  cholecalciferol (VITAMIN D) 1000 UNITS tablet Take 1,000 Units by mouth daily.   Yes Historical Provider, MD  donepezil (ARICEPT) 10 MG tablet Take 10 mg by mouth at bedtime.   Yes Historical Provider, MD  ferrous sulfate 325 (65 FE) MG tablet Take 325 mg by mouth daily with breakfast.   Yes Historical Provider, MD  glyBURIDE (DIABETA) 5 MG tablet Take 5 mg by mouth 2 (two) times daily with a meal.   Yes Historical Provider, MD  LORazepam (ATIVAN) 0.5 MG tablet Take 0.25 mg by mouth every 4 (four) hours as needed (anxiety or agitation).   Yes Historical Provider, MD  metFORMIN (GLUCOPHAGE) 1000 MG tablet Take 1,000 mg by mouth 2 (two) times daily with a meal.   Yes Historical Provider, MD  NUTRITIONAL SUPPLEMENT LIQD Take 3 oz by mouth 3 (three) times daily.   Yes Historical Provider, MD  ondansetron (ZOFRAN ODT) 4 MG disintegrating tablet Take 1 tablet (4 mg total) by mouth every 8 (eight) hours as needed for nausea or vomiting. 09/26/14  Yes Derwood Kaplan, MD  polyethylene glycol (  MIRALAX / GLYCOLAX) packet Take 17 g by mouth daily.   Yes Historical Provider, MD  senna (SENOKOT) 8.6 MG TABS tablet Take 1 tablet by mouth daily.   Yes Historical Provider, MD   BP 152/86 mmHg  Pulse 78  Temp(Src) 96.8 F (36 C) (Oral)  SpO2 99% Physical Exam Physical Exam  Nursing note and vitals reviewed. Constitutional: Well developed, well nourished, non-toxic, and in no acute distress Head: Normocephalic. Small bruising noted periorbitally under right eye. Eyes: PERRL, EOMI Mouth/Throat: Oropharynx is clear and moist.  No dental trauma. Neck: Normal  range of motion. Neck supple. No cervical spine tenderness. Cardiovascular: Normal rate and regular rhythm.  No edema. Pulmonary/Chest: Effort normal and breath sounds normal. No chest tenderness. Abdominal: Soft. There is no tenderness. There is no rebound and no guarding.  Musculoskeletal: Normal range of motion. No swelling or deformities. No TLS spine tenderness. No hip/pelvic tenderness. Neurological: Alert, no facial droop, fluent speech, moves all extremities symmetrically Skin: Skin is warm and dry.  Psychiatric: Cooperative  ED Course  Procedures (including critical care time) Labs Review Labs Reviewed - No data to display  Imaging Review Ct Head Wo Contrast  07/14/2015   CLINICAL DATA:  Found on floor at nursing home with right facial contusion.  EXAM: CT HEAD WITHOUT CONTRAST  CT MAXILLOFACIAL WITHOUT CONTRAST  CT CERVICAL SPINE WITHOUT CONTRAST  TECHNIQUE: Multidetector CT imaging of the head, cervical spine, and maxillofacial structures were performed using the standard protocol without intravenous contrast. Multiplanar CT image reconstructions of the cervical spine and maxillofacial structures were also generated.  COMPARISON:  CT of the head on 03/31/2008 at [facility] and CT of the head on 06/29/2010 at [facility].  FINDINGS: CT HEAD FINDINGS  Since prior head CT studies, there has been some progression of diffuse atrophy and periventricular small vessel disease. The brain demonstrates no evidence of hemorrhage, acute infarction, edema, mass effect, extra-axial fluid collection, hydrocephalus or mass lesion. The skull is unremarkable and shows no fracture.  CT MAXILLOFACIAL FINDINGS  No acute fracture identified. Mild soft tissue stranding in the subcutaneous fat of the right maxillary region is consistent with ecchymosis. No evidence of significant focal hematoma or soft tissue foreign body.  Dental caries with periapical abscess noted involving a right lower incisor. The  paranasal sinuses and visualized mastoid air cells are normally aerated. The nasal septum is in the midline. Orbits and extraocular musculature are symmetric and normal bilaterally. No incidental mass lesions or enlarged lymph nodes identified. The visualized skull base shows no evidence of abnormality or fracture.  CT CERVICAL SPINE FINDINGS  The cervical spine shows normal alignment. There is no evidence of acute fracture or subluxation. No soft tissue swelling or hematoma is identified. There is advanced spondylosis throughout the cervical spine with the most prominent proliferative changes at C4-5, C5-6 and C6-7. No bony or soft tissue lesions are seen. The visualized airway is normally patent.  IMPRESSION: 1. Progression of atrophy and small vessel disease of the brain. No acute findings by CT of the head. 2. Stranding in the subcutaneous fat of the right maxillary region is consistent with ecchymoses. No maxillofacial fracture is identified. 3. Dental caries with periapical abscess involving a lower right incisor. 4. Diffuse degenerative spondylosis of the cervical spine. No evidence of acute cervical injury.   Electronically Signed   By: Irish Lack M.D.   On: 07/14/2015 08:33   Ct Cervical Spine Wo Contrast  07/14/2015   CLINICAL DATA:  Found on floor at nursing home with right facial contusion.  EXAM: CT HEAD WITHOUT CONTRAST  CT MAXILLOFACIAL WITHOUT CONTRAST  CT CERVICAL SPINE WITHOUT CONTRAST  TECHNIQUE: Multidetector CT imaging of the head, cervical spine, and maxillofacial structures were performed using the standard protocol without intravenous contrast. Multiplanar CT image reconstructions of the cervical spine and maxillofacial structures were also generated.  COMPARISON:  CT of the head on 03/31/2008 at [facility] and CT of the head on 06/29/2010 at [facility].  FINDINGS: CT HEAD FINDINGS  Since prior head CT studies, there has been some progression of diffuse atrophy and  periventricular small vessel disease. The brain demonstrates no evidence of hemorrhage, acute infarction, edema, mass effect, extra-axial fluid collection, hydrocephalus or mass lesion. The skull is unremarkable and shows no fracture.  CT MAXILLOFACIAL FINDINGS  No acute fracture identified. Mild soft tissue stranding in the subcutaneous fat of the right maxillary region is consistent with ecchymosis. No evidence of significant focal hematoma or soft tissue foreign body.  Dental caries with periapical abscess noted involving a right lower incisor. The paranasal sinuses and visualized mastoid air cells are normally aerated. The nasal septum is in the midline. Orbits and extraocular musculature are symmetric and normal bilaterally. No incidental mass lesions or enlarged lymph nodes identified. The visualized skull base shows no evidence of abnormality or fracture.  CT CERVICAL SPINE FINDINGS  The cervical spine shows normal alignment. There is no evidence of acute fracture or subluxation. No soft tissue swelling or hematoma is identified. There is advanced spondylosis throughout the cervical spine with the most prominent proliferative changes at C4-5, C5-6 and C6-7. No bony or soft tissue lesions are seen. The visualized airway is normally patent.  IMPRESSION: 1. Progression of atrophy and small vessel disease of the brain. No acute findings by CT of the head. 2. Stranding in the subcutaneous fat of the right maxillary region is consistent with ecchymoses. No maxillofacial fracture is identified. 3. Dental caries with periapical abscess involving a lower right incisor. 4. Diffuse degenerative spondylosis of the cervical spine. No evidence of acute cervical injury.   Electronically Signed   By: Irish Lack M.D.   On: 07/14/2015 08:33   Ct Maxillofacial Wo Cm  07/14/2015   CLINICAL DATA:  Found on floor at nursing home with right facial contusion.  EXAM: CT HEAD WITHOUT CONTRAST  CT MAXILLOFACIAL WITHOUT CONTRAST   CT CERVICAL SPINE WITHOUT CONTRAST  TECHNIQUE: Multidetector CT imaging of the head, cervical spine, and maxillofacial structures were performed using the standard protocol without intravenous contrast. Multiplanar CT image reconstructions of the cervical spine and maxillofacial structures were also generated.  COMPARISON:  CT of the head on 03/31/2008 at [facility] and CT of the head on 06/29/2010 at [facility].  FINDINGS: CT HEAD FINDINGS  Since prior head CT studies, there has been some progression of diffuse atrophy and periventricular small vessel disease. The brain demonstrates no evidence of hemorrhage, acute infarction, edema, mass effect, extra-axial fluid collection, hydrocephalus or mass lesion. The skull is unremarkable and shows no fracture.  CT MAXILLOFACIAL FINDINGS  No acute fracture identified. Mild soft tissue stranding in the subcutaneous fat of the right maxillary region is consistent with ecchymosis. No evidence of significant focal hematoma or soft tissue foreign body.  Dental caries with periapical abscess noted involving a right lower incisor. The paranasal sinuses and visualized mastoid air cells are normally aerated. The nasal septum is in the midline. Orbits and extraocular musculature  are symmetric and normal bilaterally. No incidental mass lesions or enlarged lymph nodes identified. The visualized skull base shows no evidence of abnormality or fracture.  CT CERVICAL SPINE FINDINGS  The cervical spine shows normal alignment. There is no evidence of acute fracture or subluxation. No soft tissue swelling or hematoma is identified. There is advanced spondylosis throughout the cervical spine with the most prominent proliferative changes at C4-5, C5-6 and C6-7. No bony or soft tissue lesions are seen. The visualized airway is normally patent.  IMPRESSION: 1. Progression of atrophy and small vessel disease of the brain. No acute findings by CT of the head. 2. Stranding in the  subcutaneous fat of the right maxillary region is consistent with ecchymoses. No maxillofacial fracture is identified. 3. Dental caries with periapical abscess involving a lower right incisor. 4. Diffuse degenerative spondylosis of the cervical spine. No evidence of acute cervical injury.   Electronically Signed   By: Irish Lack M.D.   On: 07/14/2015 08:33   I have personally reviewed and evaluated these images and lab results as part of my medical decision-making.    MDM   Final diagnoses:  Fall, initial encounter  Dementia, without behavioral disturbance  Periorbital ecchymosis, right, initial encounter    Initiated with-year-old female with history of dementia who presents after fall from bed. She is well-appearing and in no acute distress on presentation. Vital signs are not concerning. She has small bruise noted periorbitally underneath the right eye. No other injuries noted on exam. Will perform CT head, cervical spine, and face. CT imaging are visualized, and shows no acute traumatic injuries. She'll be ambulated in her room prior to discharge. If stable, will discharge to assisted living facility.     Lavera Guise, MD 07/14/15 [phone]

## 2015-07-14 NOTE — ED Notes (Signed)
89 yof PMHx Alzheimers, HTN, DM, presents via EMS from [facility] after staff found patient on floor. Contusion to right cheek. Pt oriented to person and place only (baseline per EMS). Denies neck pain. Asymptomatic. EMS glucose /dl.   Assessment completed. ED MD at bedside.

## 2015-10-03 ENCOUNTER — Emergency Department (HOSPITAL_COMMUNITY)
Admission: EM | Admit: 2015-10-03 | Discharge: 2015-10-03 | Disposition: A | Discharge status: Home / Self Care | Payer: Medicare Other | Attending: Emergency Medicine | Admitting: Emergency Medicine

## 2015-10-03 DIAGNOSIS — Z872 Personal history of diseases of the skin and subcutaneous tissue: Secondary | ICD-10-CM | POA: Insufficient documentation

## 2015-10-03 DIAGNOSIS — Z8742 Personal history of other diseases of the female genital tract: Secondary | ICD-10-CM | POA: Diagnosis not present

## 2015-10-03 DIAGNOSIS — F0390 Unspecified dementia without behavioral disturbance: Secondary | ICD-10-CM | POA: Insufficient documentation

## 2015-10-03 DIAGNOSIS — E119 Type 2 diabetes mellitus without complications: Secondary | ICD-10-CM | POA: Diagnosis not present

## 2015-10-03 DIAGNOSIS — Z79899 Other long term (current) drug therapy: Secondary | ICD-10-CM | POA: Diagnosis not present

## 2015-10-03 DIAGNOSIS — Z7984 Long term (current) use of oral hypoglycemic drugs: Secondary | ICD-10-CM | POA: Diagnosis not present

## 2015-10-03 DIAGNOSIS — R197 Diarrhea, unspecified: Secondary | ICD-10-CM | POA: Diagnosis not present

## 2015-10-03 DIAGNOSIS — E785 Hyperlipidemia, unspecified: Secondary | ICD-10-CM | POA: Insufficient documentation

## 2015-10-03 DIAGNOSIS — R1110 Vomiting, unspecified: Secondary | ICD-10-CM | POA: Diagnosis present

## 2015-10-03 DIAGNOSIS — I10 Essential (primary) hypertension: Secondary | ICD-10-CM | POA: Diagnosis not present

## 2015-10-03 DIAGNOSIS — Z7982 Long term (current) use of aspirin: Secondary | ICD-10-CM | POA: Diagnosis not present

## 2015-10-03 LAB — COMPREHENSIVE METABOLIC PANEL
ALBUMIN: 3.7 g/dL (ref 3.5–5.0)
ALT: 22 U/L (ref 14–54)
AST: 18 U/L (ref 15–41)
Alkaline Phosphatase: 73 U/L (ref 38–126)
Anion gap: 7 (ref 5–15)
BUN: 21 mg/dL — ABNORMAL HIGH (ref 6–20)
CHLORIDE: 104 mmol/L (ref 101–111)
CO2: 28 mmol/L (ref 22–32)
CREATININE: 1.01 mg/dL — AB (ref 0.44–1.00)
Calcium: 9.5 mg/dL (ref 8.9–10.3)
GFR calc non Af Amer: 47 mL/min — ABNORMAL LOW (ref >60)
GFR, EST AFRICAN AMERICAN: 55 mL/min — AB (ref >60)
Glucose, Bld: 137 mg/dL — ABNORMAL HIGH (ref 65–99)
Potassium: 4.3 mmol/L (ref 3.5–5.1)
SODIUM: 139 mmol/L (ref 135–145)
Total Bilirubin: 0.5 mg/dL (ref 0.3–1.2)
Total Protein: 6.8 g/dL (ref 6.5–8.1)

## 2015-10-03 LAB — CBC WITH DIFFERENTIAL/PLATELET
BASOS ABS: 0 10*3/uL (ref 0.0–0.1)
BASOS PCT: 0 %
EOS PCT: 0 %
Eosinophils Absolute: 0 10*3/uL (ref 0.0–0.7)
HEMATOCRIT: 38 % (ref 36.0–46.0)
Hemoglobin: 12.3 g/dL (ref 12.0–15.0)
LYMPHS PCT: 10 %
Lymphs Abs: 0.7 10*3/uL (ref 0.7–4.0)
MCH: 29.6 pg (ref 26.0–34.0)
MCHC: 32.4 g/dL (ref 30.0–36.0)
MCV: 91.3 fL (ref 78.0–100.0)
Monocytes Absolute: 0.2 10*3/uL (ref 0.1–1.0)
Monocytes Relative: 3 %
NEUTROS ABS: 6.1 10*3/uL (ref 1.7–7.7)
Neutrophils Relative %: 87 %
Platelets: 245 10*3/uL (ref 150–400)
RBC: 4.16 MIL/uL (ref 3.87–5.11)
RDW: 12.4 % (ref 11.5–15.5)
WBC: 7 10*3/uL (ref 4.0–10.5)

## 2015-10-03 LAB — LIPASE, BLOOD: Lipase: 25 U/L (ref 11–51)

## 2015-10-03 LAB — TROPONIN I: Troponin I: <0.03 ng/mL (ref <0.031)

## 2015-10-03 MED ORDER — ONDANSETRON HCL 4 MG/2ML IJ SOLN
4.0000 mg | Freq: Once | INTRAMUSCULAR | Status: AC
  Administered 2015-10-03: 4 mg via INTRAVENOUS
  Filled 2015-10-03: qty 2

## 2015-10-03 MED ORDER — SODIUM CHLORIDE 0.9 % IV BOLUS (SEPSIS)
500.0000 mL | Freq: Once | INTRAVENOUS | Status: AC
  Administered 2015-10-03: 500 mL via INTRAVENOUS

## 2015-10-03 NOTE — ED Notes (Signed)
Bed: WA08 Expected date: 10/03/15 Expected time: 11:17 AM Means of arrival: Ambulance Comments: N/V

## 2015-10-03 NOTE — ED Notes (Signed)
Called facility to give report, [place] Gave report to [place].

## 2015-10-03 NOTE — Discharge Instructions (Signed)

## 2015-10-03 NOTE — ED Notes (Signed)
Bed: WHALA Expected date:  Expected time:  Means of arrival:  Comments: 

## 2015-10-03 NOTE — ED Notes (Signed)
[facility], per staff N/V/D since last night  2 episodes emesis. Hx alzheimer . Pt appears to be in pain per EMS . Pt is normally ambulatory with assistance per facility . Pt is alert and oriented to baseline per staff.

## 2015-10-03 NOTE — ED Provider Notes (Signed)
CSN: [id number]     Arrival date & time 10/03/15  1117 History   First MD Initiated Contact with Patient 10/03/15 1125     Chief Complaint  Patient presents with  . Emesis     (Consider location/radiation/quality/duration/timing/severity/associated sxs/prior Treatment) HPI Patient presents from her nursing facility with staff concerns of nausea, vomiting, diarrhea. History is provided by staff, as the patient has dementia, level V caveat. The patient herself specifically denies pain, denies discomfort. Staff reports the patient had 2 episodes of vomiting yesterday. No report of new fever, syncope, blood in stool or vomit.  Past Medical History  Diagnosis Date  . Microalbuminuria   . Pyuria   . Dermatitis   . Dementia   . Diabetes mellitus without complication   . Hyperlipidemia   . Hypertension    No past surgical history on file. No family history on file. Social History  Substance Use Topics  . Smoking status: Never Smoker   . Smokeless tobacco: Not on file  . Alcohol Use: No   OB History    No data available     Review of Systems  Unable to perform ROS: Dementia      Allergies  Review of patient's allergies indicates no known allergies.  Home Medications   Prior to Admission medications   Medication Sig Start Date End Date Taking? Authorizing Provider  acetaminophen (TYLENOL) 325 MG tablet Take 650 mg by mouth every 6 (six) hours as needed for mild pain or moderate pain.   Yes Historical Provider, MD  amLODipine (NORVASC) 5 MG tablet Take 5 mg by mouth daily.   Yes Historical Provider, MD  aspirin EC 81 MG tablet Take 81 mg by mouth daily.   Yes Historical Provider, MD  atorvastatin (LIPITOR) 10 MG tablet Take 5 mg by mouth daily.    Yes Historical Provider, MD  cholecalciferol (VITAMIN D) 1000 UNITS tablet Take 1,000 Units by mouth daily.   Yes Historical Provider, MD  donepezil (ARICEPT) 10 MG tablet Take 10 mg by mouth at bedtime.   Yes Historical  Provider, MD  ferrous sulfate 325 (65 FE) MG tablet Take 325 mg by mouth daily with breakfast.   Yes Historical Provider, MD  glyBURIDE (DIABETA) 5 MG tablet Take 2.5 mg by mouth 2 (two) times daily with a meal.    Yes Historical Provider, MD  LORazepam (ATIVAN) 0.5 MG tablet Take 0.25 mg by mouth every 4 (four) hours as needed (anxiety or agitation).   Yes Historical Provider, MD  metFORMIN (GLUCOPHAGE) 1000 MG tablet Take 1,000 mg by mouth 2 (two) times daily with a meal.   Yes Historical Provider, MD  NUTRITIONAL SUPPLEMENT LIQD Take 3 oz by mouth 3 (three) times daily.   Yes Historical Provider, MD  ondansetron (ZOFRAN ODT) 4 MG disintegrating tablet Take 1 tablet (4 mg total) by mouth every 8 (eight) hours as needed for nausea or vomiting. 09/26/14  Yes Derwood Kaplan, MD  polyethylene glycol (MIRALAX / GLYCOLAX) packet Take 17 g by mouth daily.   Yes Historical Provider, MD  senna (SENOKOT) 8.6 MG TABS tablet Take 1 tablet by mouth daily.    Historical Provider, MD   BP 128/90 mmHg  Pulse 64  Temp(Src) 98.3 F (36.8 C) (Axillary)  Resp 16  SpO2 100% Physical Exam  Constitutional: She appears well-developed and well-nourished. No distress.  HENT:  Head: Normocephalic and atraumatic.  Eyes: Conjunctivae and EOM are normal.  Cardiovascular: Normal rate and regular rhythm.   Pulmonary/Chest:  Effort normal and breath sounds normal. No stridor. No respiratory distress.  Abdominal: She exhibits no distension. There is no tenderness. There is no rigidity, no guarding, no tenderness at McBurney's point and negative Murphy's sign.  Musculoskeletal: She exhibits no edema.  Neurological: She is alert. She displays atrophy. No cranial nerve deficit.  Skin: Skin is warm and dry.  Psychiatric: She is withdrawn. Cognition and memory are impaired. She exhibits abnormal recent memory and abnormal remote memory.  Nursing note and vitals reviewed.   ED Course  Procedures (including critical care  time) Labs Review Labs Reviewed  COMPREHENSIVE METABOLIC PANEL - Abnormal; Notable for the following:    Glucose, Bld 137 (*)    BUN 21 (*)    Creatinine, Ser 1.01 (*)    GFR calc non Af Amer 47 (*)    GFR calc Af Amer 55 (*)    All other components within normal limits  LIPASE, BLOOD  TROPONIN I  CBC WITH DIFFERENTIAL/PLATELET  URINALYSIS, ROUTINE W REFLEX MICROSCOPIC (NOT AT [facility])      MDM  Elderly female presents from her nursing facility with staff concerns of nausea, vomiting, diarrhea. Here, the patient is awake, alert, afebrile. No evidence for substantial dehydration, no fever, low suspicion for bacteremia. Patient returned to her nursing/monitored facility.   Gerhard Munchobert Symiah Nowotny, MD 10/03/15 [phone]

## 2015-10-03 NOTE — ED Notes (Signed)
[facility]

## 2015-10-03 NOTE — ED Notes (Signed)
Patient unable to urinate at this time. 

## 2015-12-10 ENCOUNTER — Inpatient Hospital Stay (HOSPITAL_COMMUNITY): Payer: Medicare Other

## 2015-12-10 ENCOUNTER — Inpatient Hospital Stay (HOSPITAL_COMMUNITY)
Admission: EM | Admit: 2015-12-10 | Discharge: 2015-12-17 | DRG: 062 | Disposition: A | Discharge status: Skilled Nursing Facility | Payer: Medicare Other | Attending: Neurology | Admitting: Neurology

## 2015-12-10 ENCOUNTER — Emergency Department (HOSPITAL_COMMUNITY): Payer: Medicare Other

## 2015-12-10 ENCOUNTER — Encounter (HOSPITAL_COMMUNITY): Payer: Self-pay

## 2015-12-10 DIAGNOSIS — R471 Dysarthria and anarthria: Secondary | ICD-10-CM | POA: Diagnosis present

## 2015-12-10 DIAGNOSIS — R29810 Facial weakness: Secondary | ICD-10-CM | POA: Diagnosis present

## 2015-12-10 DIAGNOSIS — I10 Essential (primary) hypertension: Secondary | ICD-10-CM | POA: Diagnosis present

## 2015-12-10 DIAGNOSIS — R5383 Other fatigue: Secondary | ICD-10-CM | POA: Diagnosis not present

## 2015-12-10 DIAGNOSIS — I638 Other cerebral infarction: Secondary | ICD-10-CM | POA: Diagnosis not present

## 2015-12-10 DIAGNOSIS — H548 Legal blindness, as defined in USA: Secondary | ICD-10-CM | POA: Diagnosis present

## 2015-12-10 DIAGNOSIS — I6350 Cerebral infarction due to unspecified occlusion or stenosis of unspecified cerebral artery: Secondary | ICD-10-CM | POA: Diagnosis not present

## 2015-12-10 DIAGNOSIS — F0280 Dementia in other diseases classified elsewhere without behavioral disturbance: Secondary | ICD-10-CM | POA: Diagnosis present

## 2015-12-10 DIAGNOSIS — R509 Fever, unspecified: Secondary | ICD-10-CM | POA: Diagnosis not present

## 2015-12-10 DIAGNOSIS — G8194 Hemiplegia, unspecified affecting left nondominant side: Secondary | ICD-10-CM | POA: Diagnosis present

## 2015-12-10 DIAGNOSIS — E7800 Pure hypercholesterolemia, unspecified: Secondary | ICD-10-CM | POA: Diagnosis not present

## 2015-12-10 DIAGNOSIS — G309 Alzheimer's disease, unspecified: Secondary | ICD-10-CM | POA: Diagnosis present

## 2015-12-10 DIAGNOSIS — E119 Type 2 diabetes mellitus without complications: Secondary | ICD-10-CM | POA: Diagnosis present

## 2015-12-10 DIAGNOSIS — I639 Cerebral infarction, unspecified: Secondary | ICD-10-CM | POA: Diagnosis present

## 2015-12-10 DIAGNOSIS — R531 Weakness: Secondary | ICD-10-CM | POA: Diagnosis present

## 2015-12-10 DIAGNOSIS — G301 Alzheimer's disease with late onset: Secondary | ICD-10-CM | POA: Diagnosis not present

## 2015-12-10 DIAGNOSIS — F0391 Unspecified dementia with behavioral disturbance: Secondary | ICD-10-CM | POA: Diagnosis not present

## 2015-12-10 DIAGNOSIS — I6302 Cerebral infarction due to thrombosis of basilar artery: Secondary | ICD-10-CM | POA: Diagnosis not present

## 2015-12-10 DIAGNOSIS — Z7982 Long term (current) use of aspirin: Secondary | ICD-10-CM | POA: Diagnosis not present

## 2015-12-10 DIAGNOSIS — I6789 Other cerebrovascular disease: Secondary | ICD-10-CM | POA: Diagnosis not present

## 2015-12-10 DIAGNOSIS — E785 Hyperlipidemia, unspecified: Secondary | ICD-10-CM | POA: Diagnosis present

## 2015-12-10 DIAGNOSIS — F0390 Unspecified dementia without behavioral disturbance: Secondary | ICD-10-CM | POA: Diagnosis not present

## 2015-12-10 DIAGNOSIS — E1159 Type 2 diabetes mellitus with other circulatory complications: Secondary | ICD-10-CM | POA: Diagnosis not present

## 2015-12-10 LAB — I-STAT CHEM 8, ED
BUN: 22 mg/dL — AB (ref 6–20)
CHLORIDE: 102 mmol/L (ref 101–111)
Calcium, Ion: 1.25 mmol/L (ref 1.13–1.30)
Creatinine, Ser: 1.1 mg/dL — ABNORMAL HIGH (ref 0.44–1.00)
Glucose, Bld: 180 mg/dL — ABNORMAL HIGH (ref 65–99)
HCT: 38 % (ref 36.0–46.0)
Hemoglobin: 12.9 g/dL (ref 12.0–15.0)
Potassium: 4.3 mmol/L (ref 3.5–5.1)
Sodium: 141 mmol/L (ref 135–145)
TCO2: 26 mmol/L (ref 0–100)

## 2015-12-10 LAB — COMPREHENSIVE METABOLIC PANEL
ALT: 18 U/L (ref 14–54)
AST: 16 U/L (ref 15–41)
Albumin: 3.4 g/dL — ABNORMAL LOW (ref 3.5–5.0)
Alkaline Phosphatase: 68 U/L (ref 38–126)
Anion gap: 11 (ref 5–15)
BUN: 19 mg/dL (ref 6–20)
CO2: 26 mmol/L (ref 22–32)
Calcium: 10 mg/dL (ref 8.9–10.3)
Chloride: 105 mmol/L (ref 101–111)
Creatinine, Ser: 1.21 mg/dL — ABNORMAL HIGH (ref 0.44–1.00)
GFR calc Af Amer: 44 mL/min — ABNORMAL LOW (ref >60)
GFR calc non Af Amer: 38 mL/min — ABNORMAL LOW (ref >60)
Glucose, Bld: 187 mg/dL — ABNORMAL HIGH (ref 65–99)
Potassium: 4.5 mmol/L (ref 3.5–5.1)
Sodium: 142 mmol/L (ref 135–145)
Total Bilirubin: 0.4 mg/dL (ref 0.3–1.2)
Total Protein: 6.8 g/dL (ref 6.5–8.1)

## 2015-12-10 LAB — DIFFERENTIAL
Basophils Absolute: 0 10*3/uL (ref 0.0–0.1)
Basophils Relative: 1 %
Eosinophils Absolute: 0.1 10*3/uL (ref 0.0–0.7)
Eosinophils Relative: 2 %
Lymphocytes Relative: 24 %
Lymphs Abs: 1.4 10*3/uL (ref 0.7–4.0)
Monocytes Absolute: 0.3 10*3/uL (ref 0.1–1.0)
Monocytes Relative: 6 %
Neutro Abs: 3.9 10*3/uL (ref 1.7–7.7)
Neutrophils Relative %: 67 %

## 2015-12-10 LAB — CBC
HCT: 36.6 % (ref 36.0–46.0)
Hemoglobin: 12.2 g/dL (ref 12.0–15.0)
MCH: 30.3 pg (ref 26.0–34.0)
MCHC: 33.3 g/dL (ref 30.0–36.0)
MCV: 90.8 fL (ref 78.0–100.0)
Platelets: 236 10*3/uL (ref 150–400)
RBC: 4.03 MIL/uL (ref 3.87–5.11)
RDW: 12.7 % (ref 11.5–15.5)
WBC: 5.8 10*3/uL (ref 4.0–10.5)

## 2015-12-10 LAB — I-STAT TROPONIN, ED: Troponin i, poc: 0 ng/mL (ref 0.00–0.08)

## 2015-12-10 LAB — MRSA PCR SCREENING: MRSA BY PCR: NEGATIVE

## 2015-12-10 LAB — APTT: aPTT: 28 seconds (ref 24–37)

## 2015-12-10 LAB — CBG MONITORING, ED: Glucose-Capillary: 168 mg/dL — ABNORMAL HIGH (ref 65–99)

## 2015-12-10 LAB — PROTIME-INR
INR: 1.04 (ref 0.00–1.49)
Prothrombin Time: 13.8 seconds (ref 11.6–15.2)

## 2015-12-10 MED ORDER — STROKE: EARLY STAGES OF RECOVERY BOOK
Freq: Once | Status: AC
  Administered 2015-12-10: 1
  Filled 2015-12-10: qty 1

## 2015-12-10 MED ORDER — LABETALOL HCL 5 MG/ML IV SOLN
10.0000 mg | INTRAVENOUS | Status: DC | PRN
  Administered 2015-12-10 (×2): 10 mg via INTRAVENOUS
  Filled 2015-12-10 (×2): qty 4

## 2015-12-10 MED ORDER — SODIUM CHLORIDE 0.9 % IV SOLN
INTRAVENOUS | Status: DC
  Administered 2015-12-10 – 2015-12-14 (×4): via INTRAVENOUS

## 2015-12-10 MED ORDER — ACETAMINOPHEN 325 MG PO TABS: 650.0000 mg | ORAL_TABLET | ORAL | Status: DC | PRN

## 2015-12-10 MED ORDER — PANTOPRAZOLE SODIUM 40 MG IV SOLR
40.0000 mg | Freq: Every day | INTRAVENOUS | Status: DC
  Administered 2015-12-10 – 2015-12-11 (×2): 40 mg via INTRAVENOUS
  Filled 2015-12-10 (×2): qty 40

## 2015-12-10 MED ORDER — ACETAMINOPHEN 650 MG RE SUPP: 650.0000 mg | RECTAL | Status: DC | PRN

## 2015-12-10 MED ORDER — SENNOSIDES-DOCUSATE SODIUM 8.6-50 MG PO TABS: 1.0000 | ORAL_TABLET | Freq: Every evening | ORAL | Status: DC | PRN

## 2015-12-10 MED ORDER — ALTEPLASE (STROKE) FULL DOSE INFUSION
0.9000 mg/kg | Freq: Once | INTRAVENOUS | Status: AC
  Administered 2015-12-10: 59 mg via INTRAVENOUS
  Filled 2015-12-10: qty 100

## 2015-12-10 NOTE — ED Provider Notes (Signed)
CSN: [id number]     Arrival date & time 12/10/15  1220 History   First MD Initiated Contact with Patient 12/10/15 1223     Chief Complaint  Patient presents with  . Code Stroke     The history is provided by the EMS personnel. No language interpreter was used.   Gloria Velasquez is a 82 y.o. female who presents to the Emergency Department complaining of weakness.  Level V caveat due to dementia.  Hx is provided by EMS.  Patient presents as a code stroke from memory care unit for left-sided weakness.  Las seen normal at 10 AM.  Later in the day she was noted to have left-sided facial droop and difficulty with speech. No reports of illness or injury. No recent falls.  Past Medical History  Diagnosis Date  . Microalbuminuria   . Pyuria   . Dermatitis   . Dementia   . Diabetes mellitus without complication (HCC)   . Hyperlipidemia   . Hypertension    History reviewed. No pertinent past surgical history. History reviewed. No pertinent family history. Social History  Substance Use Topics  . Smoking status: Never Smoker   . Smokeless tobacco: None  . Alcohol Use: No   OB History    No data available     Review of Systems  Unable to perform ROS: Dementia      Allergies  Review of patient's allergies indicates no known allergies.  Home Medications   Prior to Admission medications   Medication Sig Start Date End Date Taking? Authorizing Provider  acetaminophen (TYLENOL) 325 MG tablet Take 650 mg by mouth every 6 (six) hours as needed for mild pain or moderate pain.    Historical Provider, MD  amLODipine (NORVASC) 5 MG tablet Take 5 mg by mouth daily.    Historical Provider, MD  aspirin EC 81 MG tablet Take 81 mg by mouth daily.    Historical Provider, MD  atorvastatin (LIPITOR) 10 MG tablet Take 5 mg by mouth daily.     Historical Provider, MD  cholecalciferol (VITAMIN D) 1000 UNITS tablet Take 1,000 Units by mouth daily.    Historical Provider, MD  donepezil (ARICEPT) 10 MG  tablet Take 10 mg by mouth at bedtime.    Historical Provider, MD  ferrous sulfate 325 (65 FE) MG tablet Take 325 mg by mouth daily with breakfast.    Historical Provider, MD  glyBURIDE (DIABETA) 5 MG tablet Take 2.5 mg by mouth 2 (two) times daily with a meal.     Historical Provider, MD  LORazepam (ATIVAN) 0.5 MG tablet Take 0.25 mg by mouth every 4 (four) hours as needed (anxiety or agitation).    Historical Provider, MD  metFORMIN (GLUCOPHAGE) 1000 MG tablet Take 1,000 mg by mouth 2 (two) times daily with a meal.    Historical Provider, MD  NUTRITIONAL SUPPLEMENT LIQD Take 3 oz by mouth 3 (three) times daily.    Historical Provider, MD  ondansetron (ZOFRAN ODT) 4 MG disintegrating tablet Take 1 tablet (4 mg total) by mouth every 8 (eight) hours as needed for nausea or vomiting. 09/26/14   Derwood Kaplan, MD  senna (SENOKOT) 8.6 MG TABS tablet Take 1 tablet by mouth daily.    Historical Provider, MD   BP 142/72 mmHg  Pulse 84  Temp(Src) 98.6 F (37 C) (Axillary)  Resp 16  Wt 143 lb 4.8 oz (65 kg)  SpO2 99% Physical Exam  Constitutional: She appears well-developed and well-nourished. No distress.  HENT:  Head: Normocephalic and atraumatic.  Cardiovascular: Normal rate.   No murmur heard. Pulmonary/Chest: Effort normal. No respiratory distress.  Abdominal: Soft. There is no tenderness. There is no rebound and no guarding.  Musculoskeletal: She exhibits no tenderness.  Neurological: She is alert.  Left sided facial weakness, LUE, LLE weakness. Dysarthric speech.   Skin: Skin is warm and dry.  Psychiatric:  Unable to assess  Nursing note and vitals reviewed.   ED Course  Procedures (including critical care time) Labs Review Labs Reviewed  COMPREHENSIVE METABOLIC PANEL - Abnormal; Notable for the following:    Glucose, Bld 187 (*)    Creatinine, Ser 1.21 (*)    Albumin 3.4 (*)    GFR calc non Af Amer 38 (*)    GFR calc Af Amer 44 (*)    All other components within normal  limits  CBG MONITORING, ED - Abnormal; Notable for the following:    Glucose-Capillary 168 (*)    All other components within normal limits  I-STAT CHEM 8, ED - Abnormal; Notable for the following:    BUN 22 (*)    Creatinine, Ser 1.10 (*)    Glucose, Bld 180 (*)    All other components within normal limits  MRSA PCR SCREENING  PROTIME-INR  APTT  CBC  DIFFERENTIAL  URINALYSIS, ROUTINE W REFLEX MICROSCOPIC (NOT AT [facility])  I-STAT TROPOININ, ED    Imaging Review Ct Head Wo Contrast  12/10/2015  CLINICAL DATA:  LEFT-sided weakness, last seen normal at 1000 hours, history hypertension, type II diabetes mellitus, dementia, hyperlipidemia EXAM: CT HEAD WITHOUT CONTRAST TECHNIQUE: Contiguous axial images were obtained from the base of the skull through the vertex without intravenous contrast. COMPARISON:  07/14/2015 FINDINGS: Generalized atrophy. Normal ventricular morphology. No midline shift or mass effect. Small vessel chronic ischemic changes of deep cerebral white matter. No intracranial hemorrhage, mass lesion, or acute infarction. Visualized paranasal sinuses and mastoid air cells clear. Bones unremarkable. Mild atherosclerotic calcification at carotid siphons. IMPRESSION: Atrophy with small vessel chronic ischemic changes of deep cerebral white matter. No acute intracranial abnormalities. Electronically Signed   By: Ulyses Southward M.D.   On: 12/10/2015 12:36   Dg Chest Portable 1 View  12/10/2015  CLINICAL DATA:  Altered mental status. EXAM: PORTABLE CHEST 1 VIEW COMPARISON:  08/29/2014 FINDINGS: Cardiomediastinal silhouette is normal. Mediastinal contours appear intact. Tortuosity and calcified atherosclerotic disease of the aorta are noted. There is no evidence of focal airspace consolidation, pleural effusion or pneumothorax. Osseous structures are without acute abnormality. Soft tissues are grossly normal. IMPRESSION: No active disease. Electronically Signed   By: Ted Mcalpine M.D.   On:  12/10/2015 13:35   I have personally reviewed and evaluated these images and lab results as part of my medical decision-making.   EKG Interpretation None      MDM   Final diagnoses:  Stroke (cerebrum) [facility])    Patient here for left-sided weakness, code stroke activated prior to ED arrival. Patient assessed by neurology team immediately and TPA was started. Plan to admit to neurology service for further treatment.   Tilden Fossa, MD 12/10/15 [phone]

## 2015-12-10 NOTE — ED Notes (Signed)
[facility]- pt here from [facility] today at 1000. Pt is able to walk and converse at baseline. Hx of dementia. Left side weakness and trouble walking per staff at facility.

## 2015-12-10 NOTE — Code Documentation (Signed)
82yo female arriving to [facility] via [facility] at 1220.  EMS reports patient from memory care unit where she was found to be lethargic with left facial droop and slurred speech.  EMS assessed left facial droop, slurred speech and left arm weakness and activated a code stroke.  Stroke team at the bedside on patient arrival.  Labs drawn and patient cleared for CT by Dr. Madilyn Hook.  Patient to CT.  Dr. Pearlean Brownie at the bedside.  NIHSS 12, see documentation for details and code stroke times.  Patient confused with left facial droop and moderate slurred speech, possible left visual field deficit as patient does not blink to threat on the left on exam.  Patient with left arm weakness and bilateral leg weakness on exam.  Nursing facility called and confirmed patient was LKW at 1000 when she was witnessed to be eating a snack.  Patient found with symptoms by staff at 1030.  Unable to contact family despite multiple attempts.  Order to mix tPA by Dr. Pearlean Brownie at 1236.  tPA mixed at the bedside by pharmacist.  NS given per MD.   tPA bolus given over 1 minute at 1247 followed by /hr for a total of  per pharmacy dosing.  Patient monitored frequently per post-tPA protocol.   Patient transported to [phone] with ED RN and Stroke RN.  Bedside handoff with 85M RN Larey Brick.

## 2015-12-10 NOTE — H&P (Signed)
Admission H&P    Chief Complaint:  Code stroke HPI: Gloria Velasquez is an 82 y.o. female  was found to have slurred speech and left-sided weakness at 10 AM while eating at the memory care unit where she resides and had trouble walking. Patient is unable to provide history and family cannot be reached at this point. The stroke nurse spoke to the nursing staff at the memory care unit on noticed that the patient was fine at 8 AM and was walking his usual. At 10 AM the noticed that while she was eating she developed some speech difficulties and left-sided weakness. This has persisted today and transfer via EMS to [facility]. Patient apparently has no known prior history of strokes or TIAs but has history of Alzheimer's dementia for the last 2-3 years. She is ambulating independently and is able to feed herself and carried out conversation at baseline. There was no witnessed seizure activity by EMS. Blood sugar was found to be to 220 mg percent.  LSN: 10 am 12/10/2015 tPA Given: Yes  Past Medical History  Diagnosis Date  . Microalbuminuria   . Pyuria   . Dermatitis   . Dementia   . Diabetes mellitus without complication ([place])   . Hyperlipidemia   . Hypertension     History reviewed. No pertinent past surgical history.  History reviewed. No pertinent family history. Social History:  reports that she has never smoked. She does not have any smokeless tobacco history on file. She reports that she does not drink alcohol or use illicit drugs.  Allergies: No Known Allergies  Medications Prior to Admission  Medication Sig Dispense Refill  . acetaminophen (TYLENOL) 325 MG tablet Take 650 mg by mouth every 6 (six) hours as needed for mild pain or moderate pain.    Marland Kitchen amLODipine (NORVASC) 5 MG tablet Take 5 mg by mouth daily.    Marland Kitchen aspirin EC 81 MG tablet Take 81 mg by mouth daily.    Marland Kitchen atorvastatin (LIPITOR) 10 MG tablet Take 5 mg by mouth daily.     . cholecalciferol (VITAMIN D) 1000 UNITS  tablet Take 1,000 Units by mouth daily.    Marland Kitchen donepezil (ARICEPT) 10 MG tablet Take 10 mg by mouth at bedtime.    . ferrous sulfate 325 (65 FE) MG tablet Take 325 mg by mouth daily with breakfast.    . glyBURIDE (DIABETA) 5 MG tablet Take 2.5 mg by mouth 2 (two) times daily with a meal.     . LORazepam (ATIVAN) 0.5 MG tablet Take 0.25 mg by mouth every 4 (four) hours as needed (anxiety or agitation).    . metFORMIN (GLUCOPHAGE) 1000 MG tablet Take 1,000 mg by mouth 2 (two) times daily with a meal.    . NUTRITIONAL SUPPLEMENT LIQD Take 3 oz by mouth 3 (three) times daily.    . ondansetron (ZOFRAN ODT) 4 MG disintegrating tablet Take 1 tablet (4 mg total) by mouth every 8 (eight) hours as needed for nausea or vomiting. 20 tablet 0  . senna (SENOKOT) 8.6 MG TABS tablet Take 1 tablet by mouth daily.      ROS: Not obtainable from the patient and and see as documented in history of present illness  Physical Examination: Blood pressure 133/94, pulse 76, temperature 98.6 F (37 C), temperature source Axillary, resp. rate 16, weight 143 lb 4.8 oz (65 kg), SpO2 97 %.   . Frail elderly African-American lady not in distress. Afebrile. Head is nontraumatic. Neck is  supple without bruit.    Cardiac exam no murmur or gallop. Lungs are clear to auscultation. Distal pulses are well felt. Neurologic Examination: Awake alert and interactive. Oriented to person only. Diminished attention, registration and recall. Moderate dysarthria but can be understood with some difficulty. Follows simple midline and one-step commands. Fundi were not visualized. Vision acuity cannot be reliably tested. She blinks to threat more on the right compared to the left. Extraocular movements are full range without nystagmus but she has slight right gaze preference she can easily track to the left past midline on the way. Moderate left lower facial weakness. Tongue is midline. Motor system exam reveals left hemiplegia with 3/5 strength.  Positive left upper and lower extremity drift. Patient's effort appears to be variable on the left. She has good antigravity strength on the right without weakness. Sensation appears to preserved bilaterally. Coordination is impaired on the left but in proportion to the weakness. Deep tendon reflexes are 1+ on the right and depressed on the left. Right plantar is downgoing left is upgoing. Gait was not tested.  NIHSS 12. Results for orders placed or performed during the hospital encounter of 12/10/15 (from the past 48 hour(s))  Protime-INR     Status: None   Collection Time: 12/10/15 12:22 PM  Result Value Ref Range   Prothrombin Time 13.8 11.6 - 15.2 seconds   INR 1.04 0.00 - 1.49  APTT     Status: None   Collection Time: 12/10/15 12:22 PM  Result Value Ref Range   aPTT 28 24 - 37 seconds  CBC     Status: None   Collection Time: 12/10/15 12:22 PM  Result Value Ref Range   WBC 5.8 4.0 - 10.5 K/uL   RBC 4.03 3.87 - 5.11 MIL/uL   Hemoglobin 12.2 12.0 - 15.0 g/dL   HCT 36.6 36.0 - 46.0 %   MCV 90.8 78.0 - 100.0 fL   MCH 30.3 26.0 - 34.0 pg   MCHC 33.3 30.0 - 36.0 g/dL   RDW 12.7 11.5 - 15.5 %   Platelets 236 150 - 400 K/uL  Differential     Status: None   Collection Time: 12/10/15 12:22 PM  Result Value Ref Range   Neutrophils Relative % 67 %   Neutro Abs 3.9 1.7 - 7.7 K/uL   Lymphocytes Relative 24 %   Lymphs Abs 1.4 0.7 - 4.0 K/uL   Monocytes Relative 6 %   Monocytes Absolute 0.3 0.1 - 1.0 K/uL   Eosinophils Relative 2 %   Eosinophils Absolute 0.1 0.0 - 0.7 K/uL   Basophils Relative 1 %   Basophils Absolute 0.0 0.0 - 0.1 K/uL  Comprehensive metabolic panel     Status: Abnormal   Collection Time: 12/10/15 12:22 PM  Result Value Ref Range   Sodium 142 135 - 145 mmol/L   Potassium 4.5 3.5 - 5.1 mmol/L   Chloride 105 101 - 111 mmol/L   CO2 26 22 - 32 mmol/L   Glucose, Bld 187 (H) 65 - 99 mg/dL   BUN 19 6 - 20 mg/dL   Creatinine, Ser 1.21 (H) 0.44 - 1.00 mg/dL   Calcium 10.0  8.9 - 10.3 mg/dL   Total Protein 6.8 6.5 - 8.1 g/dL   Albumin 3.4 (L) 3.5 - 5.0 g/dL   AST 16 15 - 41 U/L   ALT 18 14 - 54 U/L   Alkaline Phosphatase 68 38 - 126 U/L   Total Bilirubin 0.4 0.3 - 1.2 mg/dL  GFR calc non Af Amer 38 (L) >60 mL/min   GFR calc Af Amer 44 (L) >60 mL/min    Comment: (NOTE) The eGFR has been calculated using the CKD EPI equation. This calculation has not been validated in all clinical situations. eGFR's persistently <60 mL/min signify possible Chronic Kidney Disease.    Anion gap 11 5 - 15  I-stat troponin, ED (not at [facility], [facility])     Status: None   Collection Time: 12/10/15 12:27 PM  Result Value Ref Range   Troponin i, poc 0.00 0.00 - 0.08 ng/mL   Comment 3            Comment: Due to the release kinetics of cTnI, a negative result within the first hours of the onset of symptoms does not rule out myocardial infarction with certainty. If myocardial infarction is still suspected, repeat the test at appropriate intervals.   I-Stat Chem 8, ED  (not at [facility], [facility])     Status: Abnormal   Collection Time: 12/10/15 12:29 PM  Result Value Ref Range   Sodium 141 135 - 145 mmol/L   Potassium 4.3 3.5 - 5.1 mmol/L   Chloride 102 101 - 111 mmol/L   BUN 22 (H) 6 - 20 mg/dL   Creatinine, Ser 1.10 (H) 0.44 - 1.00 mg/dL   Glucose, Bld 180 (H) 65 - 99 mg/dL   Calcium, Ion 1.25 1.13 - 1.30 mmol/L   TCO2 26 0 - 100 mmol/L   Hemoglobin 12.9 12.0 - 15.0 g/dL   HCT 38.0 36.0 - 46.0 %  CBG monitoring, ED     Status: Abnormal   Collection Time: 12/10/15 12:43 PM  Result Value Ref Range   Glucose-Capillary 168 (H) 65 - 99 mg/dL   Ct Head Wo Contrast  12/10/2015  CLINICAL DATA:  LEFT-sided weakness, last seen normal at 1000 hours, history hypertension, type II diabetes mellitus, dementia, hyperlipidemia EXAM: CT HEAD WITHOUT CONTRAST TECHNIQUE: Contiguous axial images were obtained from the base of the skull through the vertex without intravenous contrast. COMPARISON:   07/14/2015 FINDINGS: Generalized atrophy. Normal ventricular morphology. No midline shift or mass effect. Small vessel chronic ischemic changes of deep cerebral white matter. No intracranial hemorrhage, mass lesion, or acute infarction. Visualized paranasal sinuses and mastoid air cells clear. Bones unremarkable. Mild atherosclerotic calcification at carotid siphons. IMPRESSION: Atrophy with small vessel chronic ischemic changes of deep cerebral white matter. No acute intracranial abnormalities. Electronically Signed   By: Lavonia Dana M.D.   On: 12/10/2015 12:36   Dg Chest Portable 1 View  12/10/2015  CLINICAL DATA:  Altered mental status. EXAM: PORTABLE CHEST 1 VIEW COMPARISON:  08/29/2014 FINDINGS: Cardiomediastinal silhouette is normal. Mediastinal contours appear intact. Tortuosity and calcified atherosclerotic disease of the aorta are noted. There is no evidence of focal airspace consolidation, pleural effusion or pneumothorax. Osseous structures are without acute abnormality. Soft tissues are grossly normal. IMPRESSION: No active disease. Electronically Signed   By: Fidela Salisbury M.D.   On: 12/10/2015 13:35    Assessment: 82 y.o. female African-American lady with sudden onset of dysarthria and left hemiplegia is likely due to right brain subcortical infarct etiology to be determined. Patient has presented within time window for thrombolysis and after carefully reviewing the risk-benefit and inclusion and exclusion criteria I feel the patient merits IV tPA. She has Alzheimer's dementia but is apparently ambulating without assistance and able to feed herself and carry out conversation and she is likely to be significantly disabled if she does  not get better I called both telephone numbers listed under patient's next of kin in the nursing home sheet and left messages for them to call me but did not hear back immediately.  Stroke Risk Factors - diabetes mellitus, hyperlipidemia and  hypertension  Plan: Admit to the intensive care unit with strict monitoring of blood pressure and neurological status. Keep blood pressure control as per post TPA protocol. 1. HgbA1c, fasting lipid panel 2. MRI, MRA  of the brain without contrast 3. PT consult, OT consult, Speech consult 4. Echocardiogram 5. Carotid dopplers 6. Prophylactic therapy-Antiplatelet med: Aspirin - 325 mg 7. Risk factor modification 8. Telemetry monitoring 9. Frequent neuro checks This patient is critically ill and at significant risk of neurological worsening, death and care requires constant monitoring of vital signs, hemodynamics,respiratory and cardiac monitoring, extensive review of multiple databases, frequent neurological assessment, discussion with family, other specialists and medical decision making of high complexity.I have made any additions or clarifications directly to the above note.This critical care time does not reflect procedure time, or teaching time or supervisory time of PA/NP/Med Resident etc but could involve care discussion time.  I spent 70 minutes of neurocritical care time  in the care of  this patient.   I have personally examined this patient, reviewed notes, independently viewed imaging studies, participated in medical decision making and plan of care. I have made any additions or clarifications directly to the above note. Agree with note above.    Antony Contras, MD Medical Director [facility] Pager: [phone] 12/10/2015 1:50 PM   12/10/2015, 1:38 PM

## 2015-12-11 ENCOUNTER — Inpatient Hospital Stay (HOSPITAL_COMMUNITY): Payer: Medicare Other

## 2015-12-11 ENCOUNTER — Ambulatory Visit (HOSPITAL_COMMUNITY): Payer: Medicare Other

## 2015-12-11 DIAGNOSIS — I638 Other cerebral infarction: Secondary | ICD-10-CM

## 2015-12-11 DIAGNOSIS — I6789 Other cerebrovascular disease: Secondary | ICD-10-CM

## 2015-12-11 LAB — LIPID PANEL
CHOL/HDL RATIO: 2.9 ratio
CHOLESTEROL: 181 mg/dL (ref 0–200)
HDL: 62 mg/dL (ref >40)
LDL CALC: 102 mg/dL — AB (ref 0–99)
TRIGLYCERIDES: 87 mg/dL (ref <150)
VLDL: 17 mg/dL (ref 0–40)

## 2015-12-11 MED ORDER — METFORMIN HCL 500 MG PO TABS
1000.0000 mg | ORAL_TABLET | Freq: Two times a day (BID) | ORAL | Status: DC
  Administered 2015-12-11 – 2015-12-15 (×5): 1000 mg via ORAL
  Filled 2015-12-11 (×8): qty 2

## 2015-12-11 MED ORDER — GLYBURIDE 2.5 MG PO TABS
2.5000 mg | ORAL_TABLET | Freq: Two times a day (BID) | ORAL | Status: DC
  Administered 2015-12-11 – 2015-12-15 (×5): 2.5 mg via ORAL
  Filled 2015-12-11 (×12): qty 1

## 2015-12-11 MED ORDER — ATORVASTATIN CALCIUM 10 MG PO TABS
5.0000 mg | ORAL_TABLET | Freq: Every day | ORAL | Status: DC
  Filled 2015-12-11: qty 1

## 2015-12-11 MED ORDER — FERROUS SULFATE 325 (65 FE) MG PO TABS
325.0000 mg | ORAL_TABLET | Freq: Every day | ORAL | Status: DC
  Administered 2015-12-13 – 2015-12-15 (×2): 325 mg via ORAL
  Filled 2015-12-11 (×8): qty 1

## 2015-12-11 MED ORDER — POLYETHYLENE GLYCOL 3350 17 G PO PACK
17.0000 g | PACK | Freq: Every day | ORAL | Status: DC
  Administered 2015-12-13 – 2015-12-17 (×3): 17 g via ORAL
  Filled 2015-12-11 (×7): qty 1

## 2015-12-11 MED ORDER — AMLODIPINE BESYLATE 5 MG PO TABS
5.0000 mg | ORAL_TABLET | Freq: Every day | ORAL | Status: DC
  Filled 2015-12-11: qty 1

## 2015-12-11 MED ORDER — HALOPERIDOL LACTATE 5 MG/ML IJ SOLN
1.0000 mg | Freq: Four times a day (QID) | INTRAMUSCULAR | Status: DC | PRN
  Administered 2015-12-11: 1 mg via INTRAVENOUS
  Filled 2015-12-11: qty 1

## 2015-12-11 MED ORDER — DONEPEZIL HCL 10 MG PO TABS
10.0000 mg | ORAL_TABLET | Freq: Every day | ORAL | Status: DC
  Administered 2015-12-11 – 2015-12-16 (×6): 10 mg via ORAL
  Filled 2015-12-11 (×6): qty 1

## 2015-12-11 MED ORDER — SENNA 8.6 MG PO TABS
1.0000 | ORAL_TABLET | Freq: Every day | ORAL | Status: DC
  Administered 2015-12-13 – 2015-12-17 (×3): 8.6 mg via ORAL
  Filled 2015-12-11 (×8): qty 1

## 2015-12-11 MED ORDER — ASPIRIN EC 81 MG PO TBEC
81.0000 mg | DELAYED_RELEASE_TABLET | Freq: Every day | ORAL | Status: DC
  Administered 2015-12-11: 81 mg via ORAL
  Filled 2015-12-11 (×2): qty 1

## 2015-12-11 NOTE — Care Management Note (Signed)
Case Management Note  Patient Details  Name: Gloria Velasquez MRN: [id number] Date of Birth: Jan 09, 1922  Subjective/Objective:  Pt admitted on 12/10/15 with Rt subcortical infarct.  PTA, pt resided at [facility]Memory Care Unit.                  Action/Plan: Will consult CSW to facilitate return to ALF when medically stable.  Will follow progress.    Expected Discharge Date:                  Expected Discharge Plan:  Assisted Living / Rest Home  In-House Referral:  Clinical Social Work  Discharge planning Services  CM Consult  Post Acute Care Choice:    Choice offered to:     DME Arranged:    DME Agency:     HH Arranged:    HH Agency:     Status of Service:  In process, will continue to follow  Medicare Important Message Given:    Date Medicare IM Given:    Medicare IM give by:    Date Additional Medicare IM Given:    Additional Medicare Important Message give by:     If discussed at Long Length of Stay Meetings, dates discussed:    Additional Comments:  Quintella Baton, RN, BSN  Trauma/Neuro ICU Case Manager [phone]

## 2015-12-11 NOTE — Progress Notes (Signed)
OT NOTE   OT order received and appreciated however this conflicts with current bedrest order set. Please increase activity tolerance as appropriate and remove bedrest from orders. . Please contact OT at [phone] if bed rest order is discontinued. OT will hold evaluation at this time and will check back as time allows pending increased activity orders.    Wyndi Northrup, Brynn   OTR/L Pager: [phone] Office: [phone] .  

## 2015-12-11 NOTE — Evaluation (Signed)
Clinical/Bedside Swallow Evaluation Patient Details  Name: MAKELLA BUCKINGHAM MRN: [id number] Date of Birth: 06/24/1922  Today's Date: 12/11/2015 Time: SLP Start Time (ACUTE ONLY): 1540 SLP Stop Time (ACUTE ONLY): 1600 SLP Time Calculation (min) (ACUTE ONLY): 20 min  Past Medical History:  Past Medical History  Diagnosis Date  . Microalbuminuria   . Pyuria   . Dermatitis   . Dementia   . Diabetes mellitus without complication (HCC)   . Hyperlipidemia   . Hypertension    Past Surgical History: History reviewed. No pertinent past surgical history. HPI:  82 y.o. female was found to have slurred speech and left-sided weakness at 10 AM 12/10/2015 (LKW) while eating at the memory care unit where she resides and had trouble walking. Patient is unable to provide history and family cannot be reached at this point. Patient apparently has no known prior history of strokes or TIAs but has history of Alzheimer's dementia for the last 2-3 years. She is ambulating independently and is able to feed herself and carried out conversation at baseline.  Dx likely Non-dominant right subcortical infarct s/p IV tPA.     Assessment / Plan / Recommendation Clinical Impression  Pt presents with functional oropharyngeal swallow despite advanced dementia, with mild CN involvement but adequate mastication, brisk swallow response, and no overt s/s of aspiration. Recommend advancing diet to mechanical soft,thin liquids; meds whole in liquids.  Pt's speech is clear with no dysarthria; she is fluent; confused.  Communication is likely at baseline.  No SLP f/u is warranted.     Aspiration Risk  Mild aspiration risk    Diet Recommendation     Medication Administration: Whole meds with liquid    Other  Recommendations Oral Care Recommendations: Oral care BID   Follow up Recommendations  None    Frequency and Duration            Prognosis        Swallow Study   General Date of Onset: 12/10/15 HPI: 82 y.o.  female was found to have slurred speech and left-sided weakness at 10 AM 12/10/2015 (LKW) while eating at the memory care unit where she resides and had trouble walking. Patient is unable to provide history and family cannot be reached at this point. Patient apparently has no known prior history of strokes or TIAs but has history of Alzheimer's dementia for the last 2-3 years. She is ambulating independently and is able to feed herself and carried out conversation at baseline.  Dx likely Non-dominant right subcortical infarct s/p IV tPA.   Type of Study: Bedside Swallow Evaluation Previous Swallow Assessment: no Diet Prior to this Study: NPO Temperature Spikes Noted: No Respiratory Status: Room air History of Recent Intubation: No Behavior/Cognition: Alert;Cooperative;Pleasant mood Oral Cavity Assessment: Within Functional Limits Oral Care Completed by SLP: No Oral Cavity - Dentition: Adequate natural dentition Vision: Functional for self-feeding Self-Feeding Abilities: Able to feed self;Needs assist Patient Positioning: Upright in bed Baseline Vocal Quality: Normal Volitional Cough: Cognitively unable to elicit Volitional Swallow: Unable to elicit    Oral/Motor/Sensory Function Overall Oral Motor/Sensory Function: Mild impairment Facial Symmetry: Abnormal symmetry left;Suspected CN VII (facial) dysfunction   Ice Chips Ice chips: Within functional limits Presentation: Spoon   Thin Liquid Thin Liquid: Within functional limits Presentation: Cup    Nectar Thick Nectar Thick Liquid: Not tested   Honey Thick Honey Thick Liquid: Not tested   Puree Puree: Within functional limits Presentation: Spoon   Solid  Kimberlyann Hollar L. Samson Frederic, MA CCC/SLP Pager  [phone]    Solid: Within functional limits        Blenda Mounts Laurice 12/11/2015,5:12 PM

## 2015-12-11 NOTE — Progress Notes (Signed)
*  PRELIMINARY RESULTS* Echocardiogram 2D Echocardiogram has been performed.  Jeryl Columbia 12/11/2015, 2:46 PM

## 2015-12-11 NOTE — Progress Notes (Signed)
PT Cancellation Note  Patient Details Name: Gloria Velasquez MRN: [id number] DOB: 1922/02/19   Cancelled Treatment:    Reason Eval/Treat Not Completed: Patient not medically ready.  Pt on strict bedrest post-tpa.  Please advance activity order once appropriate for PT and mobility.     Sunny Schlein, [place] [phone] 12/11/2015, 8:11 AM

## 2015-12-11 NOTE — Progress Notes (Signed)
STROKE TEAM PROGRESS NOTE   HISTORY OF PRESENT ILLNESS Gloria Velasquez is an 82 y.o. female was found to have slurred speech and left-sided weakness at 10 AM 12/10/2015 (LKW) while eating at the memory care unit where she resides and had trouble walking. Patient is unable to provide history and family cannot be reached at this point. The stroke nurse spoke to the nursing staff at the memory care unit on noticed that the patient was fine at 8 AM and was walking his usual. At 10 AM the noticed that while she was eating she developed some speech difficulties and left-sided weakness. This has persisted today and transfer via EMS to [facility]. Patient apparently has no known prior history of strokes or TIAs but has history of Alzheimer's dementia for the last 2-3 years. She is ambulating independently and is able to feed herself and carried out conversation at baseline. There was no witnessed seizure activity by EMS. Blood sugar was found to be to 220 mg percent. Patient was administered IV TPA. She was not an intervention candidate secondary to baseline dementia, high mRS and inability to reach family.  She was admitted to the neuro ICU for further evaluation and treatment.   SUBJECTIVE (INTERVAL HISTORY) No family is at the bedside.  Patient is talkative this am, moving more today than yesterday. Overall, looking better than yesterday.    OBJECTIVE Temp:  [97.4 F (36.3 C)-99.1 F (37.3 C)] 97.5 F (36.4 C) (02/10 0400) Pulse Rate:  [44-134] 58 (02/10 0800) Cardiac Rhythm:  [-] Sinus bradycardia (02/10 0600) Resp:  [12-21] 19 (02/10 0800) BP: (107-191)/(55-133) 129/81 mmHg (02/10 0800) SpO2:  [95 %-100 %] 100 % (02/10 0800) Weight:  [65 kg (143 lb 4.8 oz)] 65 kg (143 lb 4.8 oz) (02/09 1245)  CBC:   Recent Labs Lab 12/10/15 1222 12/10/15 1229  WBC 5.8  --   NEUTROABS 3.9  --   HGB 12.2 12.9  HCT 36.6 38.0  MCV 90.8  --   PLT 236  --     Basic Metabolic Panel:   Recent  Labs Lab 12/10/15 1222 12/10/15 1229  NA 142 141  K 4.5 4.3  CL 105 102  CO2 26  --   GLUCOSE 187* 180*  BUN 19 22*  CREATININE 1.21* 1.10*  CALCIUM 10.0  --     Lipid Panel:     Component Value Date/Time   CHOL 181 12/11/2015 0637   TRIG 87 12/11/2015 0637   HDL 62 12/11/2015 0637   CHOLHDL 2.9 12/11/2015 0637   VLDL 17 12/11/2015 0637   LDLCALC 102* 12/11/2015 0637   HgbA1c:  Lab Results  Component Value Date   HGBA1C 7.0 04/17/2007   Urine Drug Screen: No results found for: LABOPIA, COCAINSCRNUR, LABBENZ, AMPHETMU, THCU, LABBARB    IMAGING  Ct Head Wo Contrast 12/10/2015   Atrophy with small vessel chronic ischemic changes of deep cerebral white matter. No acute intracranial abnormalities.   Dg Chest Portable 1 View 12/10/2015  No active disease.    PHYSICAL EXAM Frail elderly African-American lady not in distress. . Afebrile. Head is nontraumatic. Neck is supple without bruit.    Cardiac exam no murmur or gallop. Lungs are clear to auscultation. Distal pulses are well felt. Neurological Exam ;  Awake alert disoriented 3. Diminished attention, registration, recall. Follows only occasional midline and one-step commands. Speech is clear but often tangential and does not make sense. Right gaze preference but able to the look to  the left past midline. Vision acuity cannot be reliably tested. Blinks to threat bilaterally. Fundi were not visualized. Pupils are irregular but reactive. Minimum left lower facial asymmetry. Tongue midline. Mild left hemiparesis 4/5 strength with slight drift on the left leg more than arm. Sensation appears preserved bilaterally. Left plantar upgoing right downgoing. Gait was not tested. ASSESSMENT/PLAN Gloria Velasquez is a 82 y.o. female with history of Alzheimer's dementia, hypertension, hyperlipidemia and diabetes presenting with slurred speech and left-sided weakness, difficulty walking. She received IV t-PA 12/10/2015 at 1247.   Stroke:   Non-dominant right subcortical infarct s/p IV tPA, likely secondary to small vessel disease source, workup underway  Resultant  Left hemiparesis, field but resolved  MRI  Pending, haldol on call to MRI  MRA  pending   Carotid Doppler  pending   2D Echo  pending   LDL 102  HgbA1c pending  SCDs for VTE prophylaxis Diet NPO time specified  aspirin 81 mg daily prior to admission, now on No antithrombotic as wihtin 24h of tPA, resume aspirin if imaging neg for hemorrhage. Consider change to plavix.  Ongoing aggressive stroke risk factor management  Therapy recommendations:  Pending. Ok to be OOB today.  Disposition:  pending  (resides at [facility])  Transfer to the floor  Hypertension  Stable  Hyperlipidemia  Home meds:  lipitor 10, will resume in hospital  LDL 102, goal < 70  Continue statin at discharge  Diabetes  HgbA1c pending , goal < 7.0  Other Stroke Risk Factors   Advanced age  Other Active Problems  Alzheimer's disease  Hospital day # 1  Rhoderick Moody [facility] See Amion for Pager information 12/11/2015 9:41 AM  I have personally examined this patient, reviewed notes, independently viewed imaging studies, participated in medical decision making and plan of care. I have made any additions or clarifications directly to the above note. Agree with note above. She presented with dysarthria and left face weakness and hemiparesis due to right brain subcortical infarct and was treated with IV TPA. Continue ongoing stroke evaluation. Strict blood pressure control and close neurological monitoring. Mobilize out of bed and therapy consults. This patient is critically ill and at significant risk of neurological worsening, death and care requires constant monitoring of vital signs, hemodynamics,respiratory and cardiac monitoring, extensive review of multiple databases, frequent neurological assessment, discussion with family, other  specialists and medical decision making of high complexity.I have made any additions or clarifications directly to the above note.This critical care time does not reflect procedure time, or teaching time or supervisory time of PA/NP/Med Resident etc but could involve care discussion time.  I spent 40 minutes of neurocritical care time  in the care of  this patient.      Delia Heady, MD Medical Director [facility] Pager: [phone] 12/11/2015 1:30 PM    To contact Stroke Continuity provider, please refer to [email or web address]. After hours, contact General Neurology

## 2015-12-11 NOTE — Progress Notes (Signed)
Patient transferred from 76M to 5C06 at 1800. Report received from [place], [place].

## 2015-12-11 NOTE — Progress Notes (Signed)
VASCULAR LAB PRELIMINARY  PRELIMINARY  PRELIMINARY  PRELIMINARY  Carotid duplex  completed.    Preliminary report:  Bilateral: 1-39% ICA stenosis.  Right:  Unable to insonate the vertebral artery.  Left:  Vertebral artery flow is antegrade.  Chanae Gemma, RVT 12/11/2015, 3:15 PM

## 2015-12-12 DIAGNOSIS — G301 Alzheimer's disease with late onset: Secondary | ICD-10-CM

## 2015-12-12 DIAGNOSIS — E785 Hyperlipidemia, unspecified: Secondary | ICD-10-CM

## 2015-12-12 DIAGNOSIS — E1159 Type 2 diabetes mellitus with other circulatory complications: Secondary | ICD-10-CM

## 2015-12-12 DIAGNOSIS — F0280 Dementia in other diseases classified elsewhere without behavioral disturbance: Secondary | ICD-10-CM

## 2015-12-12 DIAGNOSIS — I6302 Cerebral infarction due to thrombosis of basilar artery: Secondary | ICD-10-CM

## 2015-12-12 DIAGNOSIS — I10 Essential (primary) hypertension: Secondary | ICD-10-CM

## 2015-12-12 LAB — HEMOGLOBIN A1C
Hgb A1c MFr Bld: 7.1 % — ABNORMAL HIGH (ref 4.8–5.6)
Mean Plasma Glucose: 157 mg/dL

## 2015-12-12 LAB — GLUCOSE, CAPILLARY: Glucose-Capillary: 96 mg/dL (ref 65–99)

## 2015-12-12 MED ORDER — AMLODIPINE BESYLATE 10 MG PO TABS
10.0000 mg | ORAL_TABLET | Freq: Every day | ORAL | Status: DC
  Administered 2015-12-12 – 2015-12-17 (×4): 10 mg via ORAL
  Filled 2015-12-12 (×9): qty 1

## 2015-12-12 MED ORDER — PANTOPRAZOLE SODIUM 40 MG PO TBEC
40.0000 mg | DELAYED_RELEASE_TABLET | Freq: Every day | ORAL | Status: DC
  Administered 2015-12-12 – 2015-12-16 (×5): 40 mg via ORAL
  Filled 2015-12-12 (×5): qty 1

## 2015-12-12 MED ORDER — ATORVASTATIN CALCIUM 10 MG PO TABS
10.0000 mg | ORAL_TABLET | Freq: Every day | ORAL | Status: DC
  Administered 2015-12-13 – 2015-12-17 (×3): 10 mg via ORAL
  Filled 2015-12-12 (×8): qty 1

## 2015-12-12 MED ORDER — ASPIRIN EC 325 MG PO TBEC
325.0000 mg | DELAYED_RELEASE_TABLET | Freq: Every day | ORAL | Status: DC
Start: 2015-12-13 — End: 2015-12-17
  Administered 2015-12-12 – 2015-12-17 (×4): 325 mg via ORAL
  Filled 2015-12-12 (×9): qty 1

## 2015-12-12 NOTE — Evaluation (Addendum)
Occupational Therapy Evaluation Patient Details Name: Gloria Velasquez MRN: [id number] DOB: 10/29/1922 Today's Date: 12/12/2015    History of Present Illness 82 y.o. femaleadmitted with  slurred speech and left-sided weakness from memory care unit where she resides(2-3 years now) and had trouble walking. She is ambulating independently and is able to feed herself and carried out conversation at baseline. Pt provided TPA  MRI(+) acute infarct R PONS PMH: alzheimers, dermatitis, DM, HTN, hx CVA and TIA, pyuria,   Clinical Impression   PT admitted with R pons acute infarct. Pt currently with functional limitiations due to the deficits listed below (see OT problem list). PTA living at memory care unit ambulatory and able to feed herself. Pt will benefit from skilled OT to increase their independence and safety with adls and balance to allow discharge SNF. Pt unable to complete transfer at this time and question facility's ability to provide higher level of care. Ot to continue to follow and reassess transfers. Pt could benefit most to return to familiar setting.  OT attempting to help patient max (A) with breakfast tray. Pt closing mouth and states "no I am not hungry"  RN Drenda Freeze made aware and will reattempt food later this AM.      Follow Up Recommendations  SNF;Supervision/Assistance - 24 hour    Equipment Recommendations  3 in 1 bedside comode;Wheelchair (measurements OT);Wheelchair cushion (measurements OT);Hospital bed (defer to SNF LEVEL )    Recommendations for Other Services       Precautions / Restrictions Precautions Precautions: Fall      Mobility Bed Mobility Overal bed mobility: Needs Assistance Bed Mobility: Supine to Sit;Rolling;Sit to Supine Rolling: Total assist   Supine to sit: Max assist;HOB elevated Sit to supine: Max assist   General bed mobility comments: Pt rolling at a single unit log roll. pt attempting to resist log roll due to startle even with max v/c.  pt EOB with posterior lean and pelvic posterior tilt. Pt states "i can't sit up . I just can't. I need to lay down"   Transfers                 General transfer comment: pt unable to tolerate EOB and resisting. Pt will require total +2 complete transfer attempt safely    Balance Overall balance assessment: Needs assistance Sitting-balance support: Bilateral upper extremity supported;Feet supported Sitting balance-Leahy Scale: Zero   Postural control: Posterior lean                                  ADL Overall ADL's : Needs assistance/impaired                                       General ADL Comments: pt incontinent of bladder on arrival and peri hygiene provided. Pt adducting hips and resistant to peri hygiene. pt reports "i am cold " but unaware of void as source for feeling "cold" pt reports pain in legs with supine <>sit EOB. PT Eob without C/o LE pain. pt unable to static sit without (A)     Vision     Perception     Praxis      Pertinent Vitals/Pain Pain Assessment: Faces Faces Pain Scale: Hurts whole lot Pain Location: c/o pain with attempts to complete peri care- no break down noted at this time Pain Descriptors /  Indicators: Grimacing Pain Intervention(s): Repositioned;Monitored during session     Hand Dominance Right   Extremity/Trunk Assessment Upper Extremity Assessment Upper Extremity Assessment: RUE deficits/detail;LUE deficits/detail RUE Deficits / Details: AROM noted at hand digits elbow and shoulder. Pt able to hold arm against gravity but unable to fully assess shoulder AROM due to cognitive deficits LUE Deficits / Details: AROm shoulder ~70 degrees against gravity but unable to fully assess due to cognitive deficits. Pt noted to have AROM elbow wrist and hand   Lower Extremity Assessment Lower Extremity Assessment: Defer to PT evaluation;LLE deficits/detail;RLE deficits/detail RLE Deficits / Details: moving toes on  command LLE Deficits / Details: moving toes on command   Cervical / Trunk Assessment Cervical / Trunk Assessment: Kyphotic   Communication Communication Communication: Other (comment) (slurred speech)   Cognition Arousal/Alertness: Awake/alert Behavior During Therapy: Flat affect Overall Cognitive Status: History of cognitive impairments - at baseline                     General Comments       Exercises       Shoulder Instructions      Home Living Family/patient expects to be discharged to:: Skilled nursing facility                                        Prior Functioning/Environment Level of Independence: Needs assistance             OT Diagnosis: Generalized weakness;Cognitive deficits;Disturbance of vision;Blindness and low vision   OT Problem List: Decreased strength;Decreased range of motion;Decreased activity tolerance;Impaired balance (sitting and/or standing);Impaired vision/perception;Decreased coordination;Decreased cognition;Decreased knowledge of use of DME or AE;Decreased safety awareness;Decreased knowledge of precautions   OT Treatment/Interventions: Self-care/ADL training;Therapeutic exercise;Neuromuscular education;DME and/or AE instruction;Therapeutic activities;Cognitive remediation/compensation;Patient/family education;Balance training    OT Goals(Current goals can be found in the care plan section) Acute Rehab OT Goals Patient Stated Goal: "i am so cold"  OT Goal Formulation: Patient unable to participate in goal setting Time For Goal Achievement: 12/26/15 Potential to Achieve Goals: Fair  OT Frequency: Min 2X/week   Barriers to D/C: Other (comment) (from memory care unit)  question assist level of facility to allow patient to return to previous setting. pt will require total (A) for adls. Pt unable to complete transfer at this time.        Co-evaluation              End of Session Nurse Communication: Mobility  status;Precautions;Need for lift equipment  Activity Tolerance: Patient tolerated treatment well Patient left: in bed;with call bell/phone within reach;with bed alarm set   Time: 0802-0826 OT Time Calculation (min): 24 min Charges:  OT General Charges $OT Visit: 1 Procedure OT Evaluation $OT Eval High Complexity: 1 Procedure OT Treatments $Self Care/Home Management : 8-22 mins G-Codes:    Boone Master B 12/21/15, 8:44 AM  Mateo Flow   OTR/L Pager: [phone] Office: [phone] .

## 2015-12-12 NOTE — Progress Notes (Signed)
STROKE TEAM PROGRESS NOTE   SUBJECTIVE (INTERVAL HISTORY) No family is at the bedside.  Patient is talkative, pleasantly demented. Still has left side hemiparesis. Transferred to floor yesterday.   OBJECTIVE Temp:  [97 F (36.1 C)-98.9 F (37.2 C)] 98.9 F (37.2 C) (02/11 1321) Pulse Rate:  [58-85] 85 (02/11 1321) Cardiac Rhythm:  [-]  Resp:  [12-20] 20 (02/11 1321) BP: (139-168)/(65-144) 139/86 mmHg (02/11 1321) SpO2:  [97 %-100 %] 99 % (02/11 1321)  CBC:   Recent Labs Lab 12/10/15 1222 12/10/15 1229  WBC 5.8  --   NEUTROABS 3.9  --   HGB 12.2 12.9  HCT 36.6 38.0  MCV 90.8  --   PLT 236  --     Basic Metabolic Panel:   Recent Labs Lab 12/10/15 1222 12/10/15 1229  NA 142 141  K 4.5 4.3  CL 105 102  CO2 26  --   GLUCOSE 187* 180*  BUN 19 22*  CREATININE 1.21* 1.10*  CALCIUM 10.0  --     Lipid Panel:     Component Value Date/Time   CHOL 181 12/11/2015 0637   TRIG 87 12/11/2015 0637   HDL 62 12/11/2015 0637   CHOLHDL 2.9 12/11/2015 0637   VLDL 17 12/11/2015 0637   LDLCALC 102* 12/11/2015 0637   HgbA1c:  Lab Results  Component Value Date   HGBA1C 7.1* 12/11/2015   Urine Drug Screen: No results found for: LABOPIA, COCAINSCRNUR, LABBENZ, AMPHETMU, THCU, LABBARB    IMAGING I have personally reviewed the radiological images below and agree with the radiology interpretations.  Ct Head Wo Contrast 12/10/2015   Atrophy with small vessel chronic ischemic changes of deep cerebral white matter. No acute intracranial abnormalities.   Dg Chest Portable 1 View 12/10/2015  No active disease.   Mri and Mra Brain Wo Contrast  12/11/2015  IMPRESSION: Acute infarct right pons Atrophy and chronic microvascular ischemia. Signal loss distal right vertebral artery may be due to atherosclerotic disease and may be a cause of the acute pontine infarct. There is tortuosity which may be also contributing to signal loss in distal right vertebral artery. Left vertebral artery  does not appear to contribute to the basilar. If further arterial information is felt necessary, consider CTA of the head.   CUS - Bilateral: 1-39% ICA stenosis. Right: Unable to insonate the vertebral artery. Left: Vertebral artery flow is antegrade.  TTE - - Left ventricle: The cavity size was normal. There was moderate concentric hypertrophy. Systolic function was vigorous. The estimated ejection fraction was in the range of 65% to 70%. Wall motion was normal; there were no regional wall motion abnormalities. Features are consistent with a pseudonormal left ventricular filling pattern, with concomitant abnormal relaxation and increased filling pressure (grade 2 diastolic dysfunction).  PHYSICAL EXAM Frail elderly African-American lady not in distress. Afebrile. Head is nontraumatic. Neck is supple without bruit. Cardiac exam no murmur or gallop. Lungs are clear to auscultation. Distal pulses are well felt. Neurological Exam ;  Awake alert, disoriented 3, able to say her name. Diminished attention, registration, recall. Follows only occasional midline and one-step commands. Speech is clear but often tangential and does not make sense. Eyes in the middle position. Vision acuity cannot be reliably tested. Blinks to threat bilaterally. Fundi were not visualized. PERRL. Minimum left lower facial asymmetry. Tongue midline. Left UE 3/5 with drift and left LE not cooperative on exam, but barely against gravity on pain. RUE moving freely, RLE against gravity on pain stimulation.  Sensation appears preserved bilaterally. Not cooperative on babinski testing. Gait was not tested.  ASSESSMENT/PLAN Ms. TENESSA MARSEE is a 82 y.o. female with history of Alzheimer's dementia, hypertension, hyperlipidemia and diabetes presenting with slurred speech and left-sided weakness, difficulty walking. She received IV t-PA 12/10/2015 at 1247.   Stroke:  Right pontine acute infarct s/p IV tPA, likely  secondary to small vessel disease source  Resultant  Left hemiparesis  MRI  Right pontine infarct  MRA  Right VA signal loss, slow flow vs. occlusion   Carotid Doppler  R VA not visualized   2D Echo  EF 60-65%   LDL 102  HgbA1c 7.1  SCDs for VTE prophylaxis DIET SOFT Room service appropriate?: Yes; Fluid consistency:: Thin  aspirin 81 mg daily prior to admission, now on aspirin .  Ongoing aggressive stroke risk factor management  Therapy recommendations: SNF   Disposition:  pending  (resides at [facility])  Hypertension  Stable  On amlodipine   Hyperlipidemia  Home meds:  lipitor 10, resumed in hospital  LDL 102, goal < 70  Continue statin at discharge  Diabetes  HgbA1c 7.1, goal < 7.0  On diabeta and metformin  controlled  Other Stroke Risk Factors   Advanced age  Other Active Problems  Alzheimer's disease - on aricept  Hospital day # 2  Marvel Plan, MD PhD Stroke Neurology 12/12/2015 1:53 PM     To contact Stroke Continuity provider, please refer to [email or web address]. After hours, contact General Neurology

## 2015-12-12 NOTE — Evaluation (Signed)
Physical Therapy Evaluation Patient Details Name: Gloria Velasquez MRN: [id number] DOB: [birth year] Today's Date: 12/12/2015   History of Present Illness  82 y.o. femaleadmitted with slurred speech and left-sided weakness from memory care unit where she resides(2-3 years now) and had trouble walking. She is ambulating independently and is able to feed herself and carried out conversation at baseline. Pt provided TPA  MRI(+) acute infarct R PONS PMH: alzheimers, dermatitis, DM, HTN, hx CVA and TIA, pyuria,    Clinical Impression  Pt admitted with above diagnosis. Apparently living in memory care unit where she was able to ambulate by herself. Pt currently with functional limitations due to the deficits listed below (see PT Problem List).  Pt will benefit from skilled PT to increase their independence and safety with mobility to allow discharge to the venue listed below. (Do not expect her memory care unit can provide her necessary level of care).     Follow Up Recommendations SNF;Supervision/Assistance - 24 hour    Equipment Recommendations  None recommended by PT (TBA at next venue)    Recommendations for Other Services       Precautions / Restrictions Precautions Precautions: Fall      Mobility  Bed Mobility Overal bed mobility: Needs Assistance Bed Mobility: Rolling;Sidelying to Sit Rolling: Total assist;Max assist Sidelying to sit: Max assist       General bed mobility comments: Pt initiating rolling better to the Left, however in either direction she "freezes" due to fear of falling.  Transfers Overall transfer level: Needs assistance Equipment used: 2 person hand held assist Transfers: Stand Pivot Transfers   Stand pivot transfers: Max assist;+2 physical assistance;+2 safety/equipment       General transfer comment: essentially not using LLE and pivoted on her Rt  Ambulation/Gait                Stairs            Wheelchair Mobility    Modified  Rankin (Stroke Patients Only) Modified Rankin (Stroke Patients Only) Pre-Morbid Rankin Score: Moderate disability Modified Rankin: Severe disability     Balance Overall balance assessment: Needs assistance Sitting-balance support: No upper extremity supported;Feet supported Sitting balance-Leahy Scale: Poor Sitting balance - Comments: after working on reaching for her socks; pt sat with supervision on EOB x 5 minutes and then began to slowly lean backwards with no righting reactions noted Postural control: Posterior lean Standing balance support: Bilateral upper extremity supported Standing balance-Leahy Scale: Zero                               Pertinent Vitals/Pain Pain Assessment: Faces Faces Pain Scale: Hurts even more Pain Location: bil knees with flexion Pain Descriptors / Indicators: Grimacing Pain Intervention(s): Limited activity within patient's tolerance;Monitored during session;Repositioned    Home Living Family/patient expects to be discharged to:: Skilled nursing facility                      Prior Function Level of Independence: Needs assistance   Gait / Transfers Assistance Needed: walked alone without device           Hand Dominance   Dominant Hand: Right    Extremity/Trunk Assessment   Upper Extremity Assessment: Defer to OT evaluation           Lower Extremity Assessment: Generalized weakness;RLE deficits/detail;LLE deficits/detail RLE Deficits / Details: AAROM with limited knee flexion (~100) and grimacing due  to arthritis; strength grossly 4/5 LLE Deficits / Details: AAROM limited by knee pain; knee extension 2+  Cervical / Trunk Assessment: Kyphotic  Communication   Communication: Other (comment) (slurred speech)  Cognition Arousal/Alertness: Awake/alert Behavior During Therapy: Flat affect Overall Cognitive Status: No family/caregiver present to determine baseline cognitive functioning                       General Comments      Exercises        Assessment/Plan    PT Assessment Patient needs continued PT services  PT Diagnosis Hemiplegia non-dominant side   PT Problem List Decreased strength;Decreased range of motion;Decreased activity tolerance;Decreased balance;Decreased mobility;Decreased cognition;Decreased knowledge of use of DME;Decreased safety awareness;Impaired tone;Pain  PT Treatment Interventions DME instruction;Gait training;Functional mobility training;Therapeutic activities;Balance training;Neuromuscular re-education;Cognitive remediation;Patient/family education   PT Goals (Current goals can be found in the Care Plan section) Acute Rehab PT Goals Patient Stated Goal: unable  PT Goal Formulation: Patient unable to participate in goal setting Time For Goal Achievement: 12/26/15 Potential to Achieve Goals: Good    Frequency Min 3X/week   Barriers to discharge Decreased caregiver support      Co-evaluation               End of Session Equipment Utilized During Treatment: Gait belt Activity Tolerance: Patient tolerated treatment well Patient left: in chair;with call bell/phone within reach;with chair alarm set Nurse Communication: Mobility status;Need for lift equipment (likely maxi-move safest due to LUE weakness)         Time: 1610-9604 PT Time Calculation (min) (ACUTE ONLY): 39 min   Charges:   PT Evaluation $PT Eval Moderate Complexity: 1 Procedure PT Treatments $Therapeutic Activity: 23-37 mins   PT G Codes:        Gloria Velasquez Jan 05, 2016, 5:02 PM Pager [phone]

## 2015-12-13 ENCOUNTER — Inpatient Hospital Stay (HOSPITAL_COMMUNITY): Payer: Medicare Other

## 2015-12-13 DIAGNOSIS — R509 Fever, unspecified: Secondary | ICD-10-CM

## 2015-12-13 DIAGNOSIS — R5383 Other fatigue: Secondary | ICD-10-CM

## 2015-12-13 DIAGNOSIS — F0390 Unspecified dementia without behavioral disturbance: Secondary | ICD-10-CM

## 2015-12-13 LAB — BASIC METABOLIC PANEL
ANION GAP: 11 (ref 5–15)
BUN: 10 mg/dL (ref 6–20)
CALCIUM: 9.5 mg/dL (ref 8.9–10.3)
CHLORIDE: 108 mmol/L (ref 101–111)
CO2: 22 mmol/L (ref 22–32)
Creatinine, Ser: 0.94 mg/dL (ref 0.44–1.00)
GFR calc non Af Amer: 51 mL/min — ABNORMAL LOW (ref >60)
GFR, EST AFRICAN AMERICAN: 60 mL/min — AB (ref >60)
GLUCOSE: 160 mg/dL — AB (ref 65–99)
POTASSIUM: 3.6 mmol/L (ref 3.5–5.1)
Sodium: 141 mmol/L (ref 135–145)

## 2015-12-13 LAB — URINALYSIS W MICROSCOPIC (NOT AT ARMC)
Bilirubin Urine: NEGATIVE
Glucose, UA: 100 mg/dL — AB
Ketones, ur: 15 mg/dL — AB
Leukocytes, UA: NEGATIVE
NITRITE: POSITIVE — AB
PH: 5 (ref 5.0–8.0)
Protein, ur: 30 mg/dL — AB
SPECIFIC GRAVITY, URINE: 1.026 (ref 1.005–1.030)

## 2015-12-13 LAB — GLUCOSE, CAPILLARY
GLUCOSE-CAPILLARY: 153 mg/dL — AB (ref 65–99)
Glucose-Capillary: 148 mg/dL — ABNORMAL HIGH (ref 65–99)

## 2015-12-13 LAB — CBC
HEMATOCRIT: 36.9 % (ref 36.0–46.0)
HEMOGLOBIN: 11.9 g/dL — AB (ref 12.0–15.0)
MCH: 28.8 pg (ref 26.0–34.0)
MCHC: 32.2 g/dL (ref 30.0–36.0)
MCV: 89.3 fL (ref 78.0–100.0)
Platelets: 229 10*3/uL (ref 150–400)
RBC: 4.13 MIL/uL (ref 3.87–5.11)
RDW: 12.6 % (ref 11.5–15.5)
WBC: 10.1 10*3/uL (ref 4.0–10.5)

## 2015-12-13 MED ORDER — INSULIN ASPART 100 UNIT/ML ~~LOC~~ SOLN
0.0000 [IU] | Freq: Three times a day (TID) | SUBCUTANEOUS | Status: DC
Start: 2015-12-13 — End: 2015-12-17
  Administered 2015-12-14: 2 [IU] via SUBCUTANEOUS
  Administered 2015-12-14: 1 [IU] via SUBCUTANEOUS
  Administered 2015-12-14: 2 [IU] via SUBCUTANEOUS
  Administered 2015-12-15 (×2): 1 [IU] via SUBCUTANEOUS

## 2015-12-13 NOTE — NC FL2 (Signed)
[place] MEDICAID FL2 LEVEL OF CARE SCREENING TOOL     IDENTIFICATION  Patient Name: Gloria Velasquez Birthdate: 1922/05/20 Sex: female Admission Date (Current Location): 12/10/2015  [facility] and [place] Number:  Producer, television/film/video and Address:  [facility] [place] [facility], [address], [place], [place] [address]      Provider Number: [id number]  Attending Physician Name and Address:  Marvel Plan, MD  Relative Name and Phone Number:       Current Level of Care: Hospital Recommended Level of Care: Assisted Living Facility, Skilled Nursing Facility Prior Approval Number:    Date Approved/Denied:   PASRR Number:    Discharge Plan: Other (Comment)    Current Diagnoses: Patient Active Problem List   Diagnosis Date Noted  . Cerebellar infarction (HCC) 12/10/2015  . DIABETES MELLITUS II, UNCOMPLICATED 12/28/2006  . HYPERCHOLESTEROLEMIA 12/28/2006  . HYPERTENSION, BENIGN SYSTEMIC 12/28/2006  . ARTHRITIS 12/28/2006    Orientation RESPIRATION BLADDER Height & Weight     Self  Normal Incontinent Weight: 143 lb 4.8 oz (65 kg) Height:  5' 1.81" (157 cm) (10-28-13)  BEHAVIORAL SYMPTOMS/MOOD NEUROLOGICAL BOWEL NUTRITION STATUS      Incontinent Diet (Soft)  AMBULATORY STATUS COMMUNICATION OF NEEDS Skin   Extensive Assist Verbally Normal                       Personal Care Assistance Level of Assistance  Bathing, Feeding, Dressing Bathing Assistance: Maximum assistance Feeding assistance: Independent Dressing Assistance: Maximum assistance     Functional Limitations Info  Sight, Hearing, Speech Sight Info: Adequate Hearing Info: Adequate Speech Info: Adequate    SPECIAL CARE FACTORS FREQUENCY  PT (By licensed PT), OT (By licensed OT)                    Contractures      Additional Factors Info  Code Status Code Status Info: FULL             Current Medications (12/13/2015):  This is the current hospital active medication  list Current Facility-Administered Medications  Medication Dose Route Frequency Provider Last Rate Last Dose  . 0.9 %  sodium chloride infusion   Intravenous Continuous Micki Riley, MD 50 mL/hr at 12/12/15 2059    . acetaminophen (TYLENOL) tablet 650 mg  650 mg Oral Q4H PRN Micki Riley, MD       Or  . acetaminophen (TYLENOL) suppository 650 mg  650 mg Rectal Q4H PRN Micki Riley, MD      . amLODipine (NORVASC) tablet 10 mg  10 mg Oral Daily Marvel Plan, MD   10 mg at 12/13/15 0949  . aspirin EC tablet 325 mg  325 mg Oral Daily Marvel Plan, MD   325 mg at 12/12/15 1702  . atorvastatin (LIPITOR) tablet 10 mg  10 mg Oral Daily Marvel Plan, MD   10 mg at 12/13/15 0949  . donepezil (ARICEPT) tablet 10 mg  10 mg Oral QHS Layne Benton, NP   10 mg at 12/12/15 2048  . ferrous sulfate tablet 325 mg  325 mg Oral Q breakfast Layne Benton, NP   325 mg at 12/13/15 0848  . glyBURIDE (DIABETA) tablet 2.5 mg  2.5 mg Oral BID WC Layne Benton, NP   2.5 mg at 12/13/15 0848  . haloperidol lactate (HALDOL) injection 1 mg  1 mg Intravenous Q6H PRN Layne Benton, NP   1 mg at 12/11/15  1008  . labetalol (NORMODYNE,TRANDATE) injection 10 mg  10 mg Intravenous Q10 min PRN Micki Riley, MD   10 mg at 12/10/15 1958  . metFORMIN (GLUCOPHAGE) tablet 1,000 mg  1,000 mg Oral BID WC Layne Benton, NP   1,000 mg at 12/13/15 0848  . pantoprazole (PROTONIX) EC tablet 40 mg  40 mg Oral QHS Marvel Plan, MD   40 mg at 12/12/15 2048  . polyethylene glycol (MIRALAX / GLYCOLAX) packet 17 g  17 g Oral Daily Layne Benton, NP   17 g at 12/13/15 0949  . senna (SENOKOT) tablet 8.6 mg  1 tablet Oral Daily Layne Benton, NP   8.6 mg at 12/13/15 0949  . senna-docusate (Senokot-S) tablet 1 tablet  1 tablet Oral QHS PRN Micki Riley, MD         Discharge Medications: Please see discharge summary for a list of discharge medications.  Relevant Imaging Results:  Relevant Lab Results:   Additional Information SS#:  [id number]  Loleta Dicker, LCSW

## 2015-12-13 NOTE — Progress Notes (Signed)
CSW spoke with facility staff Tonya who verified patient is from [facility]. Archie Patten advised CSW to call back on Monday to speak with Joni Reining regarding providing a higher level of care for the patient. CSW will follow up with facility on tomorrow and speak to family regarding current recommendation.   Fernande Boyden, LCSWA Clinical Social Worker [facility] Ph: [phone]

## 2015-12-13 NOTE — Progress Notes (Signed)
STROKE TEAM PROGRESS NOTE   SUBJECTIVE (INTERVAL HISTORY) No family at bedside. Pt seem lethargic today. Had temp 100.8 F in pm. RN said she is not eating much and currently has NS @ 50cc/h. Urinary incontinence and not sure about the outpt today. Will check UA, CXR, CBC, BMP.    OBJECTIVE Temp:  [97.9 F (36.6 C)-98.7 F (37.1 C)] 98 F (36.7 C) (02/12 1414) Pulse Rate:  [79-85] 79 (02/12 1414) Cardiac Rhythm:  [-]  Resp:  [16-20] 20 (02/12 1414) BP: (127-170)/(60-87) 127/60 mmHg (02/12 1414) SpO2:  [98 %-100 %] 99 % (02/12 1414)  CBC:   Recent Labs Lab 12/10/15 1222 12/10/15 1229  WBC 5.8  --   NEUTROABS 3.9  --   HGB 12.2 12.9  HCT 36.6 38.0  MCV 90.8  --   PLT 236  --     Basic Metabolic Panel:   Recent Labs Lab 12/10/15 1222 12/10/15 1229  NA 142 141  K 4.5 4.3  CL 105 102  CO2 26  --   GLUCOSE 187* 180*  BUN 19 22*  CREATININE 1.21* 1.10*  CALCIUM 10.0  --     Lipid Panel:     Component Value Date/Time   CHOL 181 12/11/2015 0637   TRIG 87 12/11/2015 0637   HDL 62 12/11/2015 0637   CHOLHDL 2.9 12/11/2015 0637   VLDL 17 12/11/2015 0637   LDLCALC 102* 12/11/2015 0637   HgbA1c:  Lab Results  Component Value Date   HGBA1C 7.1* 12/11/2015   Urine Drug Screen: No results found for: LABOPIA, COCAINSCRNUR, LABBENZ, AMPHETMU, THCU, LABBARB    IMAGING I have personally reviewed the radiological images below and agree with the radiology interpretations.  Ct Head Wo Contrast 12/10/2015    Atrophy with small vessel chronic ischemic changes of deep cerebral white matter. No acute intracranial abnormalities.   Dg Chest Portable 1 View 12/10/2015   No active disease.   Mri and Mra Brain Wo Contrast 12/11/2015   IMPRESSION: Acute infarct right pons Atrophy and chronic microvascular ischemia. Signal loss distal right vertebral artery may be due to atherosclerotic disease and may be a cause of the acute pontine infarct. There is tortuosity which may be also  contributing to signal loss in distal right vertebral artery. Left vertebral artery does not appear to contribute to the basilar. If further arterial information is felt necessary, consider CTA of the head.   CUS - Bilateral: 1-39% ICA stenosis. Right: Unable to insonate the vertebral artery. Left: Vertebral artery flow is antegrade.  TTE - - Left ventricle: The cavity size was normal. There was moderate concentric hypertrophy. Systolic function was vigorous. The estimated ejection fraction was in the range of 65% to 70%. Wall motion was normal; there were no regional wall motion abnormalities. Features are consistent with a pseudonormal left ventricular filling pattern, with concomitant abnormal relaxation and increased filling pressure (grade 2 diastolic dysfunction).   PHYSICAL EXAM  Temp:  [97.9 F (36.6 C)-100.8 F (38.2 C)] 100.8 F (38.2 C) (02/12 1708) Pulse Rate:  [79-101] 101 (02/12 1708) Resp:  [16-20] 18 (02/12 1708) BP: (127-168)/(60-85) 168/83 mmHg (02/12 1708) SpO2:  [98 %-100 %] 100 % (02/12 1708)  General - Well nourished, well developed, lethargic.  Ophthalmologic - Fundi not visualized due to noncooperation.  Cardiovascular - Regular rate and rhythm.  Neuro - awake, eyes open, lethargic, not orientated to time, place or  People. Not following commands. Speech is tangential and does not make sense. Eyes in  the middle position. Blinks to threat bilaterally. PERRL. Minimum left lower facial asymmetry. Tongue midline. Left UE 3/5 with drift and left LE not cooperative on exam, but barely against gravity on pain. RUE moving freely, RLE against gravity on pain stimulation. Sensation appears preserved bilaterally. Not cooperative on babinski testing. Gait was not tested.   ASSESSMENT/PLAN [name] is a 81 y.o. female with history of Alzheimer's dementia, hypertension, hyperlipidemia and diabetes presenting with slurred speech and left-sided  weakness, difficulty walking. She received IV t-PA 12/10/2015 at 1247.   Stroke:  Right pontine acute infarct s/p IV tPA, likely secondary to small vessel disease source  Resultant  Left hemiparesis  MRI  Right pontine infarct  MRA  Right VA signal loss, slow flow vs. occlusion   Carotid Doppler  R VA not visualized   2D Echo  EF 60-65%   LDL 102  HgbA1c 7.1  SCDs for VTE prophylaxis DIET SOFT Room service appropriate?: Yes; Fluid consistency:: Thin  aspirin 81 mg daily prior to admission, now on aspirin .  Ongoing aggressive stroke risk factor management  Therapy recommendations: SNF   Disposition:  pending  (resides at [facility])  Fever/lethargy  Will check UA, CXR  Will check CBC and BMP  Continue IVF   Hypertension  Stable  On amlodipine   Hyperlipidemia  Home meds:  lipitor 10, resumed in hospital  LDL 102, goal < 70  Continue statin at discharge  Diabetes  HgbA1c 7.1, goal < 7.0  On diabeta and metformin  Controlled  Other Stroke Risk Factors   Advanced age  Other Active Problems  Alzheimer's disease - on aricept  Hospital day # 3  Pt lethargic today. Started to have fever 100.8 F in the pm. Not as active as yesterday. Not eating well. Will need further work up as this time. Will need CBC, BMP, UA and portable CXR. The patient is at risk for respiratory infection, sepsis, recurrent strokes and neurological worsening and she needs ongoing stroke evaluation and aggressive risk factor modification.    Marvel Plan, MD PhD Stroke Neurology 12/13/2015 5:41 PM      To contact Stroke Continuity provider, please refer to [email or web address]. After hours, contact General Neurology

## 2015-12-14 LAB — CBC
HCT: 36 % (ref 36.0–46.0)
HEMOGLOBIN: 11.5 g/dL — AB (ref 12.0–15.0)
MCH: 28.4 pg (ref 26.0–34.0)
MCHC: 31.9 g/dL (ref 30.0–36.0)
MCV: 88.9 fL (ref 78.0–100.0)
Platelets: 206 10*3/uL (ref 150–400)
RBC: 4.05 MIL/uL (ref 3.87–5.11)
RDW: 12.6 % (ref 11.5–15.5)
WBC: 9.6 10*3/uL (ref 4.0–10.5)

## 2015-12-14 LAB — GLUCOSE, CAPILLARY
GLUCOSE-CAPILLARY: 159 mg/dL — AB (ref 65–99)
GLUCOSE-CAPILLARY: 149 mg/dL — AB (ref 65–99)
GLUCOSE-CAPILLARY: 177 mg/dL — AB (ref 65–99)
GLUCOSE-CAPILLARY: 171 mg/dL — AB (ref 65–99)

## 2015-12-14 LAB — BASIC METABOLIC PANEL
Anion gap: 10 (ref 5–15)
BUN: 10 mg/dL (ref 6–20)
CHLORIDE: 109 mmol/L (ref 101–111)
CO2: 21 mmol/L — ABNORMAL LOW (ref 22–32)
Calcium: 9.4 mg/dL (ref 8.9–10.3)
Creatinine, Ser: 0.97 mg/dL (ref 0.44–1.00)
GFR calc Af Amer: 57 mL/min — ABNORMAL LOW (ref >60)
GFR calc non Af Amer: 50 mL/min — ABNORMAL LOW (ref >60)
GLUCOSE: 175 mg/dL — AB (ref 65–99)
POTASSIUM: 3.4 mmol/L — AB (ref 3.5–5.1)
Sodium: 140 mmol/L (ref 135–145)

## 2015-12-14 MED ORDER — POTASSIUM CHLORIDE 10 MEQ/100ML IV SOLN
10.0000 meq | Freq: Once | INTRAVENOUS | Status: AC
  Administered 2015-12-14: 10 meq via INTRAVENOUS
  Filled 2015-12-14: qty 100

## 2015-12-14 MED ORDER — POTASSIUM CHLORIDE CRYS ER 20 MEQ PO TBCR
40.0000 meq | EXTENDED_RELEASE_TABLET | ORAL | Status: DC
  Filled 2015-12-14: qty 2

## 2015-12-14 MED ORDER — ASPIRIN 300 MG RE SUPP
300.0000 mg | Freq: Once | RECTAL | Status: AC
  Administered 2015-12-14: 300 mg via RECTAL
  Filled 2015-12-14: qty 1

## 2015-12-14 NOTE — Progress Notes (Signed)
CSW spoke with facility representative Joni Reining regarding patient's possible return once medically stable. Per Joni Reining, patient can return to facility once ready for discharge. Per Joni Reining, she will need FL2, H&P, and DC Summary to be faxed to [phone]. CSW to send information to facility.  CSW will continue to follow and provide support while in hospital.   Fernande Boyden, [facility] Clinical Social Worker [facility] Ph: [phone]

## 2015-12-14 NOTE — Progress Notes (Signed)
STROKE TEAM PROGRESS NOTE   SUBJECTIVE (INTERVAL HISTORY) Patient with breakfast in front of her, head laid against the side rail, eyes closed, sleepy drowsy and lethargid. Required stimulation for minimal response. In the afternoon, I came back to see her. She was sitting in chair, eyes open, when I encourage her to eat more, she said "I do not eat more" "do you want me to eat more than you?"     OBJECTIVE Temp:  [97.7 F (36.5 C)-100.8 F (38.2 C)] 97.7 F (36.5 C) (02/13 0535) Pulse Rate:  [79-101] 98 (02/13 0535) Cardiac Rhythm:  [-]  Resp:  [16-20] 18 (02/13 0535) BP: (127-168)/(60-83) 158/75 mmHg (02/13 0535) SpO2:  [98 %-100 %] 98 % (02/13 0535)  CBC:   Recent Labs Lab 12/10/15 1222  12/13/15 1832 12/14/15 0336  WBC 5.8  --  10.1 9.6  NEUTROABS 3.9  --   --   --   HGB 12.2  < > 11.9* 11.5*  HCT 36.6  < > 36.9 36.0  MCV 90.8  --  89.3 88.9  PLT 236  --  229 206  < > = values in this interval not displayed.  Basic Metabolic Panel:   Recent Labs Lab 12/13/15 1832 12/14/15 0336  NA 141 140  K 3.6 3.4*  CL 108 109  CO2 22 21*  GLUCOSE 160* 175*  BUN 10 10  CREATININE 0.94 0.97  CALCIUM 9.5 9.4    Lipid Panel:     Component Value Date/Time   CHOL 181 12/11/2015 0637   TRIG 87 12/11/2015 0637   HDL 62 12/11/2015 0637   CHOLHDL 2.9 12/11/2015 0637   VLDL 17 12/11/2015 0637   LDLCALC 102* 12/11/2015 0637   HgbA1c:  Lab Results  Component Value Date   HGBA1C 7.1* 12/11/2015   Urine Drug Screen: No results found for: LABOPIA, COCAINSCRNUR, LABBENZ, AMPHETMU, THCU, LABBARB    IMAGING I have personally reviewed the radiological images below and agree with the radiology interpretations.  Ct Head Wo Contrast 12/10/2015    Atrophy with small vessel chronic ischemic changes of deep cerebral white matter. No acute intracranial abnormalities.   Dg Chest Portable 1 View 12/13/2015 Minimal right basilar atelectasis. 12/10/2015   No active disease.   Mri and  Mra Brain Wo Contrast 12/11/2015   IMPRESSION: Acute infarct right pons Atrophy and chronic microvascular ischemia. Signal loss distal right vertebral artery may be due to atherosclerotic disease and may be a cause of the acute pontine infarct. There is tortuosity which may be also contributing to signal loss in distal right vertebral artery. Left vertebral artery does not appear to contribute to the basilar. If further arterial information is felt necessary, consider CTA of the head.   CUS - Bilateral: 1-39% ICA stenosis. Right: Unable to insonate the vertebral artery. Left: Vertebral artery flow is antegrade.  TTE - - Left ventricle: The cavity size was normal. There was moderateconcentric hypertrophy. Systolic function was vigorous. Theestimated ejection fraction was in the range of 65% to 70%. Wallmotion was normal; there were no regional wall motionabnormalities. Features are consistent with a pseudonormal leftventricular filling pattern, with concomitant abnormal relaxationand increased filling pressure (grade 2 diastolic dysfunction).   PHYSICAL EXAM Temp:  [97.7 F (36.5 C)-100.8 F (38.2 C)] 97.7 F (36.5 C) (02/13 0535) Pulse Rate:  [79-101] 98 (02/13 0535) Resp:  [16-20] 18 (02/13 0535) BP: (127-168)/(60-83) 158/75 mmHg (02/13 0535) SpO2:  [98 %-100 %] 98 % (02/13 0535)  General - Well nourished,  well developed, lethargic.  Ophthalmologic - Fundi not visualized due to noncooperation.  Cardiovascular - Regular rate and rhythm.  Neuro - awake, alert, eyes open, mildly lethargic, not orientated to time, place or  People. Not following commands. Speech is tangential and make sense intermittently. Eyes in the middle position. Blinks to threat bilaterally. PERRL. Minimum left lower facial asymmetry. Tongue midline. Left UE 3/5 with drift and left LE not cooperative on exam, but barely against gravity on pain. RUE moving freely, RLE against gravity on pain stimulation. Sensation  appears preserved bilaterally. Not cooperative on babinski testing. Gait was not tested.   ASSESSMENT/PLAN Ms. CHAISE PASSARELLA is a 82 y.o. female with history of Alzheimer's dementia, hypertension, hyperlipidemia and diabetes presenting with slurred speech and left-sided weakness, difficulty walking. She received IV t-PA 12/10/2015 at 1247.   Stroke:  Right pontine acute infarct s/p IV tPA, likely secondary to small vessel disease source  Resultant  Left hemiparesis  MRI  Right pontine infarct  MRA  Right VA signal loss, slow flow vs. occlusion   Carotid Doppler  R VA not visualized   2D Echo  EF 60-65%   LDL 102  HgbA1c 7.1  SCDs for VTE prophylaxis DIET SOFT Room service appropriate?: Yes; Fluid consistency:: Thin  aspirin 81 mg daily prior to admission, now on aspirin .  Ongoing aggressive stroke risk factor management  Therapy recommendations: SNF   Disposition:  pending  (resides at [facility] PTA). Medically ready for discharge tomorrow if lethargy improved.   Fever/lethargy  WBC normal. K3.4, Glu 175  UA pos nitrate, many bacteria, but 0-5 WBC  CXR minimal R atx  IVF at 50/hr   TM 100.8 yesterday at 5p  Hypertension  Stable  On amlodipine   Hyperlipidemia  Home meds:  lipitor 10, resumed in hospital  LDL 102, goal < 70  Continue statin at discharge  Diabetes  HgbA1c 7.1, goal < 7.0  On diabeta and metformin  Controlled  Other Stroke Risk Factors   Advanced age  Other Active Problems  Alzheimer's disease - on aricept  Legally blind  Hospital day # 4  Marvel Plan, MD PhD Stroke Neurology 12/14/2015 3:15 PM    To contact Stroke Continuity provider, please refer to [email or web address]. After hours, contact General Neurology

## 2015-12-14 NOTE — Care Management Important Message (Signed)
Important Message  Patient Details  Name: Gloria Velasquez MRN: [id number] Date of Birth: [birth year]   Medicare Important Message Given:  Yes    Oralia Rud Atharva Mirsky 12/14/2015, 1:38 PM

## 2015-12-15 LAB — GLUCOSE, CAPILLARY
GLUCOSE-CAPILLARY: 143 mg/dL — AB (ref 65–99)
Glucose-Capillary: 124 mg/dL — ABNORMAL HIGH (ref 65–99)
Glucose-Capillary: 117 mg/dL — ABNORMAL HIGH (ref 65–99)
Glucose-Capillary: 100 mg/dL — ABNORMAL HIGH (ref 65–99)

## 2015-12-15 LAB — BASIC METABOLIC PANEL
Anion gap: 13 (ref 5–15)
BUN: 14 mg/dL (ref 6–20)
CHLORIDE: 107 mmol/L (ref 101–111)
CO2: 21 mmol/L — ABNORMAL LOW (ref 22–32)
CREATININE: 0.99 mg/dL (ref 0.44–1.00)
Calcium: 9.3 mg/dL (ref 8.9–10.3)
GFR calc Af Amer: 56 mL/min — ABNORMAL LOW (ref >60)
GFR calc non Af Amer: 48 mL/min — ABNORMAL LOW (ref >60)
Glucose, Bld: 146 mg/dL — ABNORMAL HIGH (ref 65–99)
Potassium: 3.5 mmol/L (ref 3.5–5.1)
SODIUM: 141 mmol/L (ref 135–145)

## 2015-12-15 LAB — CBC
HCT: 33.5 % — ABNORMAL LOW (ref 36.0–46.0)
HEMOGLOBIN: 11.2 g/dL — AB (ref 12.0–15.0)
MCH: 29.9 pg (ref 26.0–34.0)
MCHC: 33.4 g/dL (ref 30.0–36.0)
MCV: 89.6 fL (ref 78.0–100.0)
PLATELETS: 183 10*3/uL (ref 150–400)
RBC: 3.74 MIL/uL — ABNORMAL LOW (ref 3.87–5.11)
RDW: 12.7 % (ref 11.5–15.5)
WBC: 9.2 10*3/uL (ref 4.0–10.5)

## 2015-12-15 MED ORDER — LISINOPRIL 20 MG PO TABS
20.0000 mg | ORAL_TABLET | Freq: Every day | ORAL | Status: DC
  Administered 2015-12-15 – 2015-12-17 (×2): 20 mg via ORAL
  Filled 2015-12-15 (×4): qty 1

## 2015-12-15 NOTE — Clinical Social Work Note (Signed)
Clinical Social Work Assessment  Patient Details  Name: Gloria Velasquez MRN: [id number] Date of Birth: 04/12/1922  Date of referral:  12/14/15               Reason for consult:  Facility Placement                Permission sought to share information with:  Case Manager, Family Supports Permission granted to share information::  Yes, Verbal Permission Granted  Name::     Gloria Velasquez   Agency::     Relationship::  [employer] Information:  [id number]  Housing/Transportation Living arrangements for the past 2 months:  Assisted Living Facility [facility]) Source of Information:  Other (Comment Required) Gloria Velasquez) Patient Interpreter Needed:  None Criminal Activity/Legal Involvement Pertinent to Current Situation/Hospitalization:  No - Comment as needed Significant Relationships:  Other(Comment) (Relative) Lives with:  Facility Resident Do you feel safe going back to the place where you live?  Yes Need for family participation in patient care:  Yes (Comment)  Care giving concerns:  Per Nephew, if the patient is able to return back to her Assisted Living Facility then he is agreeable to that. Nephew informed CSW that he is unsure of whether or not the facility can provide a higher level of care for the patient. Nephew provided CSW with permission to fax patient's clinical information out to the facilities in [facility] as a follow up.    Social Worker assessment / plan:  CSW received consult by Charity fundraiser. CSW went to speak with patient regarding the possible need for short term rehab. CSW attempted to introduce self and acknowledge the patient however patient was in a deep sleep and would not wake for CSW assessment. CSW followed up with patient's nephew Gloria Velasquez at [phone]. CSW informed him of current PT recommendation for short term rehab. Nephew is agreeable to SNF placement, however would like to explore whether or not [facility] can provide the level of care she  needs. CSW discussed list of SNF facilities in [place] via telephone, and was provided permission to fax clinical information out to the facilities. Nephew's preference is [facility]. CSW to initiate SNF placement process.    Employment status:  Disabled (Comment on whether or not currently receiving Disability) Insurance information:  Managed Care PT Recommendations:  Skilled Nursing Facility Information / Referral to community resources:  Skilled Nursing Facility (Went over SNF list over phone, per [employer] request)  Patient/Family's Response to care:  Gloria Velasquez is agreeable to disposition plan and no concerns were reported at this time.   Patient/Family's Understanding of and Emotional Response to Diagnosis, Current Treatment, and Prognosis: Nephew is aware and understanding of current treatment and prognosis. Nephew is agreeable to disposition plan and is appreciative of CSW services and intervention.    Emotional Assessment Appearance:  Appears stated age Attitude/Demeanor/Rapport:  Unable to Assess Affect (typically observed):  Unable to Assess Orientation:  Oriented to Self Alcohol / Substance use:  Not Applicable Psych involvement (Current and /or in the community):  No (Comment)  Discharge Needs  Concerns to be addressed:  Discharge Planning Concerns Readmission within the last 30 days:  No Current discharge risk:  Physical Impairment Barriers to Discharge:  Barriers Resolved   Loleta Dicker, LCSW 12/15/2015, 12:37 PM

## 2015-12-15 NOTE — Care Management Note (Signed)
Case Management Note  Patient Details  Name: ALEKSANDRA RABEN MRN: [id number] Date of Birth: 1922/10/29  Subjective/Objective:                    Action/Plan: Patient from [facility]. Recommendations are for SNF. [facility] not will to take patient back. CSW working on placement. CM continuing to follow for discharge needs.   Expected Discharge Date:                  Expected Discharge Plan:  Assisted Living / Rest Home  In-House Referral:  Clinical Social Work  Discharge planning Services  CM Consult  Post Acute Care Choice:    Choice offered to:     DME Arranged:    DME Agency:     HH Arranged:    HH Agency:     Status of Service:  In process, will continue to follow  Medicare Important Message Given:  Yes Date Medicare IM Given:    Medicare IM give by:    Date Additional Medicare IM Given:    Additional Medicare Important Message give by:     If discussed at Long Length of Stay Meetings, dates discussed:    Additional Comments:  Kermit Balo, RN 12/15/2015, 3:46 PM

## 2015-12-15 NOTE — Progress Notes (Signed)
Occupational Therapy Treatment Patient Details Name: Gloria Velasquez MRN: [id number] DOB: 07/12/1922 Today's Date: 12/15/2015    History of present illness 82 y.o. femaleadmitted with slurred speech and left-sided weakness from memory care unit where she resides(2-3 years now) and had trouble walking. She is ambulating independently and is able to feed herself and carried out conversation at baseline. Pt provided TPA  MRI(+) acute infarct R PONS PMH: alzheimers, dermatitis, DM, HTN, hx CVA and TIA, pyuria,   OT comments  Co treatment with PTA , Aimee Aundria Rud, please see note for further details. Pt able to follow 1 step commands this session and able to sit on EOB with varying levels of assist but at times she was able to engage in functional task of washing face with close supervision for balance. Pt transferred with +2 total physical assist for squat pivot into recliner chair. All needed items within reach upon exiting the room.    Follow Up Recommendations  SNF;Supervision/Assistance - 24 hour    Equipment Recommendations  3 in 1 bedside comode;Wheelchair (measurements OT);Wheelchair cushion (measurements OT);Hospital bed    Recommendations for Other Services      Precautions / Restrictions Precautions Precautions: Fall Restrictions Weight Bearing Restrictions: No       Mobility Bed Mobility Overal bed mobility: Needs Assistance Bed Mobility: Rolling;Sidelying to Sit Rolling: +2 for physical assistance;+2 for safety/equipment Sidelying to sit: +2 for physical assistance Supine to sit: Max assist;+2 for physical assistance;Total assist;HOB elevated     General bed mobility comments: Pt able to follow commands inconsistently for R hand placement on rail required use of draw pad to assist in rolling for brief change and perianal care.  Pt performed sitting edge of bed intially requiring mod assist and progressed to min assist to maintain sitting edge of bed.  OT performed reaching  and weight shifting activities as well as ADLs in seated position.    Transfers Overall transfer level: Needs assistance Equipment used: None Transfers: Sit to/from [employer] Sit to Stand: Total assist;+2 physical assistance;Max assist   Squat pivot transfers: Max assist;+2 physical assistance;Total assist     General transfer comment: Pt has difficulty grasping concept and cues to advance to standing and performing pivot to chair.  Pt required +2 max - total assist to maintain mobility.      Balance     Sitting balance-Leahy Scale: Poor       Standing balance-Leahy Scale: Zero          ADL Overall ADL's : Needs assistance/impaired          General ADL Comments: Pt supine in bed upon entering the room. Pt had been incontinent of bladder and required +2 assist for rolling L <> R with manual faciliation, VCs, and tactile cues for initiation. Pt unable to cleanse self and required  assistance with hygiene as well. Supine >sit with +2 total A to EOB. Pt required mod A progressing to close supervision and able to follow commands to wash face with increased time to initiate.                 Cognition   Behavior During Therapy: Flat affect Overall Cognitive Status: No family/caregiver present to determine baseline cognitive functioning                                    Pertinent Vitals/ Pain  Pain Assessment: Faces Faces Pain Scale: Hurts little more Pain Location: B knees with flexion Pain Descriptors / Indicators: Guarding;Grimacing Pain Intervention(s): Repositioned         Frequency Min 2X/week     Progress Toward Goals  OT Goals(current goals can now be found in the care plan section)  Progress towards OT goals: Progressing toward goals     Plan Discharge plan remains appropriate    Co-evaluation      Reason for Co-Treatment: For patient/therapist safety;Necessary to address cognition/behavior during functional  activity          End of Session Equipment Utilized During Treatment: Gait belt   Activity Tolerance Patient tolerated treatment well   Patient Left in chair;with call bell/phone within reach;with chair alarm set   Nurse Communication          Time: 1610-9604 OT Time Calculation (min): 29 min  Charges: OT General Charges $OT Visit: 1 Procedure OT Treatments $Self Care/Home Management : 8-22 mins  Lowella Grip, MS, OTR/L 12/15/2015, 4:30 PM

## 2015-12-15 NOTE — Progress Notes (Signed)
CSW followed up with facility representative Joni Reining from [facility] regarding patient's possible return back to the facility. Per Joni Reining, patient will not be able to return since SNF placement is recommended. CSW has initiated SNF Placement process. CSW to inform Nephew of facility's decision. CSW will continue to follow and provide support to patient while in hospital.   Fernande Boyden, [facility] Clinical Social Worker [facility] Ph: [phone]

## 2015-12-15 NOTE — Progress Notes (Signed)
Physical Therapy Treatment Patient Details Name: Gloria Velasquez MRN: [id number] DOB: 04-24-1922 Today's Date: 12/15/2015    History of Present Illness 82 y.o. femaleadmitted with slurred speech and left-sided weakness from memory care unit where she resides(2-3 years now) and had trouble walking. She is ambulating independently and is able to feed herself and carried out conversation at baseline. Pt provided TPA  MRI(+) acute infarct R PONS PMH: alzheimers, dermatitis, DM, HTN, hx CVA and TIA, pyuria,    PT Comments    Pt demonstrated improved sitting balance but remains to require +2 max/total assist for all mobility.    Follow Up Recommendations  SNF;Supervision/Assistance - 24 hour     Equipment Recommendations  None recommended by PT    Recommendations for Other Services       Precautions / Restrictions Precautions Precautions: Fall Restrictions Weight Bearing Restrictions: No    Mobility  Bed Mobility Overal bed mobility: Needs Assistance Bed Mobility: Rolling;Sidelying to Sit Rolling: Max assist   Supine to sit: Max assist;+2 for physical assistance;Total assist;HOB elevated     General bed mobility comments: Pt able to follow commands inconsistently for R hand placement on rail required use of draw pad to assist in rolling for brief change and perianal care.  Pt performed sitting edge of bed intially requiring mod assist and progressed to min assist to maintain sitting edge of bed.  OT performed reaching and weight shifting activities as well as ADLs in seated position.    Transfers Overall transfer level: Needs assistance Equipment used: None Transfers: Sit to/from [employer] Sit to Stand: Total assist;+2 physical assistance;Max assist   Squat pivot transfers: Max assist;+2 physical assistance;Total assist     General transfer comment: Pt has difficulty grasping concept and cues to advance to standing and performing pivot to chair.  Pt  required +2 max - total assist to maintain mobility.    Ambulation/Gait Ambulation/Gait assistance:  (remains unable.  )               Stairs            Wheelchair Mobility    Modified Rankin (Stroke Patients Only)       Balance     Sitting balance-Leahy Scale: Poor       Standing balance-Leahy Scale: Zero                      Cognition Arousal/Alertness: Awake/alert Behavior During Therapy: Flat affect Overall Cognitive Status: No family/caregiver present to determine baseline cognitive functioning (Pt progressed to be able to follow commands as tx progressed.  )                      Exercises      General Comments        Pertinent Vitals/Pain Pain Assessment: Faces Faces Pain Scale: Hurts little more Pain Descriptors / Indicators: Grimacing Pain Intervention(s): Repositioned    Home Living                      Prior Function            PT Goals (current goals can now be found in the care plan section) Acute Rehab PT Goals Potential to Achieve Goals: Good Progress towards PT goals: Progressing toward goals    Frequency  Min 3X/week    PT Plan      Co-evaluation PT/OT/SLP Co-Evaluation/Treatment: Yes Reason for Co-Treatment: For patient/therapist safety;Necessary to  address cognition/behavior during functional activity       OT present without PTA from 14:48-14:57 PTA entered room from 14:57-15:17  End of Session Equipment Utilized During Treatment: Gait belt Activity Tolerance: Patient tolerated treatment well Patient left: in chair;with call bell/phone within reach;with chair alarm set     Time: 1610-9604 PT Time Calculation (min) (ACUTE ONLY): 20 min  Charges:  $Therapeutic Activity: 8-22 mins                    G Codes:      Florestine Avers 18-Dec-2015, 3:45 PM  Joycelyn Rua, PTA pager [phone]

## 2015-12-15 NOTE — Progress Notes (Signed)
STROKE TEAM PROGRESS NOTE   SUBJECTIVE (INTERVAL HISTORY) Patient much more awake this am. Easily responsive. Talking. SW looking into return to ALF vs SNF. D/c IVF.   OBJECTIVE Temp:  [98 F (36.7 C)-100.2 F (37.9 C)] 98.7 F (37.1 C) (02/14 0927) Pulse Rate:  [68-98] 68 (02/14 0927) Cardiac Rhythm:  [-]  Resp:  [16-18] 18 (02/14 0927) BP: (138-172)/(65-79) 159/66 mmHg (02/14 0927) SpO2:  [99 %-100 %] 100 % (02/14 0927)  CBC:   Recent Labs Lab 12/10/15 1222  12/14/15 0336 12/15/15 0548  WBC 5.8  < > 9.6 9.2  NEUTROABS 3.9  --   --   --   HGB 12.2  < > 11.5* 11.2*  HCT 36.6  < > 36.0 33.5*  MCV 90.8  < > 88.9 89.6  PLT 236  < > 206 183  < > = values in this interval not displayed.  Basic Metabolic Panel:   Recent Labs Lab 12/14/15 0336 12/15/15 0548  NA 140 141  K 3.4* 3.5  CL 109 107  CO2 21* 21*  GLUCOSE 175* 146*  BUN 10 14  CREATININE 0.97 0.99  CALCIUM 9.4 9.3    Lipid Panel:     Component Value Date/Time   CHOL 181 12/11/2015 0637   TRIG 87 12/11/2015 0637   HDL 62 12/11/2015 0637   CHOLHDL 2.9 12/11/2015 0637   VLDL 17 12/11/2015 0637   LDLCALC 102* 12/11/2015 0637   HgbA1c:  Lab Results  Component Value Date   HGBA1C 7.1* 12/11/2015   Urine Drug Screen: No results found for: LABOPIA, COCAINSCRNUR, LABBENZ, AMPHETMU, THCU, LABBARB    IMAGING I have personally reviewed the radiological images below and agree with the radiology interpretations.  Ct Head Wo Contrast 12/10/2015    Atrophy with small vessel chronic ischemic changes of deep cerebral white matter. No acute intracranial abnormalities.   Dg Chest Portable 1 View 12/13/2015 Minimal right basilar atelectasis. 12/10/2015   No active disease.   Mri and Mra Brain Wo Contrast 12/11/2015   IMPRESSION: Acute infarct right pons Atrophy and chronic microvascular ischemia. Signal loss distal right vertebral artery may be due to atherosclerotic disease and may be a cause of the acute  pontine infarct. There is tortuosity which may be also contributing to signal loss in distal right vertebral artery. Left vertebral artery does not appear to contribute to the basilar. If further arterial information is felt necessary, consider CTA of the head.   CUS - Bilateral: 1-39% ICA stenosis. Right: Unable to insonate the vertebral artery. Left: Vertebral artery flow is antegrade.  TTE - Left ventricle: The cavity size was normal. There was moderateconcentric hypertrophy. Systolic function was vigorous. Theestimated ejection fraction was in the range of 65% to 70%. Wallmotion was normal; there were no regional wall motionabnormalities. Features are consistent with a pseudonormal leftventricular filling pattern, with concomitant abnormal relaxationand increased filling pressure (grade 2 diastolic dysfunction).   PHYSICAL EXAM Temp:  [98 F (36.7 C)-100.2 F (37.9 C)] 98.7 F (37.1 C) (02/14 0927) Pulse Rate:  [68-98] 68 (02/14 0927) Resp:  [16-18] 18 (02/14 0927) BP: (138-172)/(65-79) 159/66 mmHg (02/14 0927) SpO2:  [99 %-100 %] 100 % (02/14 0927)  General - Well nourished, well developed, not in .  Ophthalmologic - Fundi not visualized due to noncooperation.  Cardiovascular - Regular rate and rhythm.  Neuro - awake, alert, eyes open, not orientated to time, place or People. Not following commands. Speech is dysarthric but able to understand. Eyes in  the middle position. Blinks to threat bilaterally. PERRL. Minimum left lower facial asymmetry. Tongue midline. Left UE 3/5 with drift and left LE not cooperative on exam, but barely against gravity on pain. RUE moving freely, RLE against gravity on pain stimulation. Sensation appears preserved bilaterally. Not cooperative on babinski testing. Gait was not tested.   ASSESSMENT/PLAN [name] is a 82 y.o. female with history of Alzheimer's dementia, hypertension, hyperlipidemia and diabetes presenting with slurred  speech and left-sided weakness, difficulty walking. She received IV t-PA 12/10/2015 at 1247.   Stroke:  Right pontine acute infarct s/p IV tPA, likely secondary to small vessel disease source  Resultant  Left hemiparesis  MRI  Right pontine infarct  MRA  Right VA signal loss, slow flow vs. occlusion   Carotid Doppler  R VA not visualized   2D Echo  EF 60-65%   LDL 102  HgbA1c 7.1  SCDs for VTE prophylaxis DIET SOFT Room service appropriate?: Yes; Fluid consistency:: Thin  aspirin 81 mg daily prior to admission, now on aspirin .  Ongoing aggressive stroke risk factor management  Therapy recommendations: SNF   Disposition:  pending  (resides at [facility]). Medically ready for discharge when bed is available  Fever/lethargy  WBC normal. Glu 146  UA pos nitrate, many bacteria, but 0-5 WBC  CXR minimal R atx  TM 100.8 2 days ago, remains normal since  D/c IVF  Hypertension  On the high side  On amlodipine   Add lisinopril  daily  Hyperlipidemia  Home meds:  lipitor 10, resumed in hospital  LDL 102, goal < 70  Continue statin at discharge  Diabetes  HgbA1c 7.1, goal < 7.0  On diabeta and metformin  Controlled  Other Stroke Risk Factors   Advanced age  Other Active Problems  Alzheimer's disease - on aricept  Legally blind  Hospital day # 5  Marvel Plan, MD PhD Stroke Neurology 12/15/2015 6:18 PM   To contact Stroke Continuity provider, please refer to [email or web address]. After hours, contact General Neurology

## 2015-12-16 DIAGNOSIS — F0391 Unspecified dementia with behavioral disturbance: Secondary | ICD-10-CM

## 2015-12-16 LAB — GLUCOSE, CAPILLARY
GLUCOSE-CAPILLARY: 61 mg/dL — AB (ref 65–99)
GLUCOSE-CAPILLARY: 84 mg/dL (ref 65–99)
GLUCOSE-CAPILLARY: 70 mg/dL (ref 65–99)
Glucose-Capillary: 76 mg/dL (ref 65–99)

## 2015-12-16 MED ORDER — SODIUM CHLORIDE 0.9 % IV SOLN
INTRAVENOUS | Status: DC
  Administered 2015-12-16: 16:00:00 via INTRAVENOUS

## 2015-12-16 NOTE — Clinical Social Work Note (Addendum)
CSW spoke with [facility] who said they can take patient tomorrow once patient is medically ready and discharge summary has been completed.  CSW updated physician, bedside nurse, and patient's nephew Jonny Ruiz,  CSW to continue to follow patient's progress.  Ervin Knack. Jantzen Pilger, MSW, Theresia Majors [phone] 12/16/2015 5:46 PM

## 2015-12-16 NOTE — Progress Notes (Signed)
STROKE TEAM PROGRESS NOTE   SUBJECTIVE (INTERVAL HISTORY) Patient awake alert this am,  Easily responsive, talking, but just not eating much, not drinking much. She speaks but when tried to give her water, she close her mouth and avoid eating or drinking. I talked with her nephew Gloria Velasquez over the phone and learned that she was not eating well at home if nobody eating together with her. Will put her back to IVF. SW looking into SNF placement.   OBJECTIVE Temp:  [97.8 F (36.6 C)-98.6 F (37 C)] 98 F (36.7 C) (02/15 1430) Pulse Rate:  [73-82] 82 (02/15 1430) Cardiac Rhythm:  [-]  Resp:  [16] 16 (02/15 1430) BP: (124-140)/(53-96) 140/60 mmHg (02/15 1430) SpO2:  [98 %-100 %] 98 % (02/15 1430)  CBC:   Recent Labs Lab 12/10/15 1222  12/14/15 0336 12/15/15 0548  WBC 5.8  < > 9.6 9.2  NEUTROABS 3.9  --   --   --   HGB 12.2  < > 11.5* 11.2*  HCT 36.6  < > 36.0 33.5*  MCV 90.8  < > 88.9 89.6  PLT 236  < > 206 183  < > = values in this interval not displayed.  Basic Metabolic Panel:   Recent Labs Lab 12/14/15 0336 12/15/15 0548  NA 140 141  K 3.4* 3.5  CL 109 107  CO2 21* 21*  GLUCOSE 175* 146*  BUN 10 14  CREATININE 0.97 0.99  CALCIUM 9.4 9.3    Lipid Panel:     Component Value Date/Time   CHOL 181 12/11/2015 0637   TRIG 87 12/11/2015 0637   HDL 62 12/11/2015 0637   CHOLHDL 2.9 12/11/2015 0637   VLDL 17 12/11/2015 0637   LDLCALC 102* 12/11/2015 0637   HgbA1c:  Lab Results  Component Value Date   HGBA1C 7.1* 12/11/2015   Urine Drug Screen: No results found for: LABOPIA, COCAINSCRNUR, LABBENZ, AMPHETMU, THCU, LABBARB    IMAGING I have personally reviewed the radiological images below and agree with the radiology interpretations.  Ct Head Wo Contrast 12/10/2015    Atrophy with small vessel chronic ischemic changes of deep cerebral white matter. No acute intracranial abnormalities.   Dg Chest Portable 1 View 12/13/2015 Minimal right basilar  atelectasis. 12/10/2015   No active disease.   Mri and Mra Brain Wo Contrast 12/11/2015   IMPRESSION: Acute infarct right pons Atrophy and chronic microvascular ischemia. Signal loss distal right vertebral artery may be due to atherosclerotic disease and may be a cause of the acute pontine infarct. There is tortuosity which may be also contributing to signal loss in distal right vertebral artery. Left vertebral artery does not appear to contribute to the basilar. If further arterial information is felt necessary, consider CTA of the head.   CUS - Bilateral: 1-39% ICA stenosis. Right: Unable to insonate the vertebral artery. Left: Vertebral artery flow is antegrade.  TTE - Left ventricle: The cavity size was normal. There was moderateconcentric hypertrophy. Systolic function was vigorous. Theestimated ejection fraction was in the range of 65% to 70%. Wallmotion was normal; there were no regional wall motionabnormalities. Features are consistent with a pseudonormal leftventricular filling pattern, with concomitant abnormal relaxationand increased filling pressure (grade 2 diastolic dysfunction).   PHYSICAL EXAM Temp:  [97.8 F (36.6 C)-98.6 F (37 C)] 98 F (36.7 C) (02/15 1430) Pulse Rate:  [73-82] 82 (02/15 1430) Resp:  [16] 16 (02/15 1430) BP: (124-140)/(53-96) 140/60 mmHg (02/15 1430) SpO2:  [98 %-100 %] 98 % (02/15 1430)  General - Well nourished, well developed, not in .  Ophthalmologic - Fundi not visualized due to noncooperation.  Cardiovascular - Regular rate and rhythm.  Neuro - awake, alert, eyes open, not orientated to time, place or People. Not following commands. Speech is dysarthric but able to understand. Eyes in the middle position. Blinks to threat bilaterally. PERRL. Minimum left lower facial asymmetry. Tongue midline. Left UE 3/5 with drift and left LE not cooperative on exam, but barely against gravity on pain. RUE moving freely, RLE against gravity on pain  stimulation. Sensation appears preserved bilaterally. Not cooperative on babinski testing. Gait was not tested.   ASSESSMENT/PLAN Gloria Velasquez is a 82 y.o. female with history of Alzheimer's dementia, hypertension, hyperlipidemia and diabetes presenting with slurred speech and left-sided weakness, difficulty walking. She received IV t-PA 12/10/2015 at 1247.   Stroke:  Right pontine acute infarct s/p IV tPA, likely secondary to small vessel disease source  Resultant  Left hemiparesis  MRI  Right pontine infarct  MRA  Right VA signal loss, slow flow vs. occlusion   Carotid Doppler  R VA not visualized   2D Echo  EF 60-65%   LDL 102  HgbA1c 7.1  SCDs for VTE prophylaxis DIET SOFT Room service appropriate?: Yes; Fluid consistency:: Thin  aspirin 81 mg daily prior to admission, now on aspirin .  Ongoing aggressive stroke risk factor management  Therapy recommendations: SNF   Disposition:  Medically ready for discharge when bed is available  Fever/lethargy  WBC normal. Glu 146  UA pos nitrate, many bacteria, but 0-5 WBC  CXR minimal R atx  TM 100.8 2 days ago, remains normal since  D/c IVF  Hypertension  On the high side  On amlodipine   Add lisinopril  daily  Hyperlipidemia  Home meds:  lipitor 10, resumed in hospital  LDL 102, goal < 70  Continue statin at discharge  Diabetes  HgbA1c 7.1, goal < 7.0  On diabeta and metformin  Controlled  Other Stroke Risk Factors   Advanced age  Other Active Problems  Alzheimer's disease - on aricept  Legally blind  Hospital day # 6  Marvel Plan, MD PhD Stroke Neurology 12/16/2015 6:20 PM   To contact Stroke Continuity provider, please refer to [email or web address]. After hours, contact General Neurology

## 2015-12-16 NOTE — Clinical Social Work Note (Signed)
CSW contacted patient's nephew Connye Burkitt to discuss bed offers.  Patient's nephew would like [facility], CSW contacted [facility] who will review patient information and contact CSW back if they can take her.  CSW to continue to follow patient's progress.  Ervin Knack. Azzure Garabedian, MSW, Theresia Majors [phone] 12/16/2015 1:33 PM

## 2015-12-16 NOTE — Progress Notes (Addendum)
Results for MAGDELENE, RUARK (MRN [id number]) as of 12/16/2015 11:46  Ref. Range 12/15/2015 16:20 12/15/2015 21:59 12/16/2015 06:09 12/16/2015 09:22 12/16/2015 11:27  Glucose-Capillary Latest Ref Range: 65-99 mg/dL 409 (H) 811 (H) 61 (L) 84 76  Noted that blood sugars have been less than 100 mg/dl. Consider decreasing dosage or stopping glyburide if CBGs continue to be low. Will continue to assess blood sugars while in the hospital. Smith Mince RN BSN CDE

## 2015-12-17 DIAGNOSIS — F0280 Dementia in other diseases classified elsewhere without behavioral disturbance: Secondary | ICD-10-CM | POA: Diagnosis present

## 2015-12-17 DIAGNOSIS — I6350 Cerebral infarction due to unspecified occlusion or stenosis of unspecified cerebral artery: Secondary | ICD-10-CM

## 2015-12-17 DIAGNOSIS — R509 Fever, unspecified: Secondary | ICD-10-CM | POA: Diagnosis not present

## 2015-12-17 DIAGNOSIS — F0281 Dementia in other diseases classified elsewhere with behavioral disturbance: Secondary | ICD-10-CM

## 2015-12-17 DIAGNOSIS — G309 Alzheimer's disease, unspecified: Secondary | ICD-10-CM

## 2015-12-17 DIAGNOSIS — R5383 Other fatigue: Secondary | ICD-10-CM | POA: Diagnosis not present

## 2015-12-17 DIAGNOSIS — E7800 Pure hypercholesterolemia, unspecified: Secondary | ICD-10-CM

## 2015-12-17 LAB — GLUCOSE, CAPILLARY
GLUCOSE-CAPILLARY: 78 mg/dL (ref 65–99)
GLUCOSE-CAPILLARY: 79 mg/dL (ref 65–99)

## 2015-12-17 LAB — CBC
HCT: 31.7 % — ABNORMAL LOW (ref 36.0–46.0)
HEMOGLOBIN: 10.6 g/dL — AB (ref 12.0–15.0)
MCH: 29.9 pg (ref 26.0–34.0)
MCHC: 33.4 g/dL (ref 30.0–36.0)
MCV: 89.5 fL (ref 78.0–100.0)
PLATELETS: 207 10*3/uL (ref 150–400)
RBC: 3.54 MIL/uL — ABNORMAL LOW (ref 3.87–5.11)
RDW: 12.7 % (ref 11.5–15.5)
WBC: 6.7 10*3/uL (ref 4.0–10.5)

## 2015-12-17 LAB — BASIC METABOLIC PANEL
ANION GAP: 11 (ref 5–15)
BUN: 16 mg/dL (ref 6–20)
CALCIUM: 9.4 mg/dL (ref 8.9–10.3)
CO2: 24 mmol/L (ref 22–32)
CREATININE: 0.9 mg/dL (ref 0.44–1.00)
Chloride: 110 mmol/L (ref 101–111)
GFR calc Af Amer: >60 mL/min (ref >60)
GFR, EST NON AFRICAN AMERICAN: 54 mL/min — AB (ref >60)
GLUCOSE: 83 mg/dL (ref 65–99)
Potassium: 3.5 mmol/L (ref 3.5–5.1)
Sodium: 145 mmol/L (ref 135–145)

## 2015-12-17 MED ORDER — ASPIRIN 325 MG PO TBEC: 325.0000 mg | DELAYED_RELEASE_TABLET | Freq: Every day | ORAL | Status: DC

## 2015-12-17 MED ORDER — ATORVASTATIN CALCIUM 10 MG PO TABS: 10.0000 mg | ORAL_TABLET | Freq: Every day | ORAL | Status: AC

## 2015-12-17 MED ORDER — ENSURE ENLIVE PO LIQD
237.0000 mL | Freq: Two times a day (BID) | ORAL | Status: DC
  Administered 2015-12-17 (×2): 237 mL via ORAL
  Filled 2015-12-17 (×4): qty 237

## 2015-12-17 MED ORDER — LISINOPRIL 20 MG PO TABS: 20.0000 mg | ORAL_TABLET | Freq: Every day | ORAL | Status: AC

## 2015-12-17 MED ORDER — ENSURE ENLIVE PO LIQD: 237.0000 mL | Freq: Two times a day (BID) | ORAL | Status: AC

## 2015-12-17 MED ORDER — ADULT MULTIVITAMIN W/MINERALS CH
1.0000 | ORAL_TABLET | Freq: Every day | ORAL | Status: DC
  Administered 2015-12-17: 1 via ORAL

## 2015-12-17 NOTE — Discharge Summary (Signed)
Stroke Discharge Summary  Patient ID: Gloria Velasquez   MRN: [id number]      DOB: 1922-03-30  Date of Admission: 12/10/2015 Date of Discharge: 12/17/2015  Attending Physician:  Marvel Plan, MD, Stroke MD Patient's PCP:  No primary care provider on file.  DISCHARGE DIAGNOSIS:  Principal Problem:   Right pontine stroke (HCC) s/p IV tPA Active Problems:   Diabetes type 2, controlled (HCC)   HYPERCHOLESTEROLEMIA   HYPERTENSION, BENIGN SYSTEMIC   Fever, unspecified   Lethargy   Alzheimer's disease    Past Medical History  Diagnosis Date  . Microalbuminuria   . Pyuria   . Dermatitis   . Dementia   . Diabetes mellitus without complication (HCC)   . Hyperlipidemia   . Hypertension    History reviewed. No pertinent past surgical history.    Medication List    STOP taking these medications        LORazepam 0.5 MG tablet  Commonly known as:  ATIVAN     ondansetron 4 MG disintegrating tablet  Commonly known as:  ZOFRAN ODT      TAKE these medications        acetaminophen 325 MG tablet  Commonly known as:  TYLENOL  Take 650 mg by mouth every 6 (six) hours as needed for mild pain or moderate pain.     amLODipine 10 MG tablet  Commonly known as:  NORVASC  Take 10 mg by mouth daily.     aspirin 325 MG EC tablet  Take 1 tablet (325 mg total) by mouth daily.     atorvastatin 10 MG tablet  Commonly known as:  LIPITOR  Take 1 tablet (10 mg total) by mouth daily.     cholecalciferol 1000 units tablet  Commonly known as:  VITAMIN D  Take 1,000 Units by mouth daily.     donepezil 10 MG tablet  Commonly known as:  ARICEPT  Take 10 mg by mouth at bedtime.     feeding supplement (ENSURE ENLIVE) Liqd  Take 237 mLs by mouth 2 (two) times daily between meals.     ferrous sulfate 325 (65 FE) MG tablet  Take 325 mg by mouth daily with breakfast.     lisinopril 20 MG tablet  Commonly known as:  PRINIVIL,ZESTRIL  Take 1 tablet (20 mg total) by mouth daily.      metFORMIN 1000 MG tablet  Commonly known as:  GLUCOPHAGE  Take 1,000 mg by mouth 2 (two) times daily with a meal.     polyethylene glycol packet  Commonly known as:  MIRALAX / GLYCOLAX  Take 17 g by mouth daily.     senna 8.6 MG Tabs tablet  Commonly known as:  SENOKOT  Take 1 tablet by mouth daily.        LABORATORY STUDIES CBC    Component Value Date/Time   WBC 6.7 12/17/2015 0650   RBC 3.54* 12/17/2015 0650   HGB 10.6* 12/17/2015 0650   HCT 31.7* 12/17/2015 0650   PLT 207 12/17/2015 0650   MCV 89.5 12/17/2015 0650   MCH 29.9 12/17/2015 0650   MCHC 33.4 12/17/2015 0650   RDW 12.7 12/17/2015 0650   LYMPHSABS 1.4 12/10/2015 1222   MONOABS 0.3 12/10/2015 1222   EOSABS 0.1 12/10/2015 1222   BASOSABS 0.0 12/10/2015 1222   CMP    Component Value Date/Time   NA 145 12/17/2015 0650   K 3.5 12/17/2015 0650   CL 110 12/17/2015 0650  CO2 24 12/17/2015 0650   GLUCOSE 83 12/17/2015 0650   BUN 16 12/17/2015 0650   CREATININE 0.90 12/17/2015 0650   CALCIUM 9.4 12/17/2015 0650   PROT 6.8 12/10/2015 1222   ALBUMIN 3.4* 12/10/2015 1222   AST 16 12/10/2015 1222   ALT 18 12/10/2015 1222   ALKPHOS 68 12/10/2015 1222   BILITOT 0.4 12/10/2015 1222   GFRNONAA 54* 12/17/2015 0650   GFRAA >60 12/17/2015 0650   COAGS Lab Results  Component Value Date   INR 1.04 12/10/2015   Lipid Panel    Component Value Date/Time   CHOL 181 12/11/2015 0637   TRIG 87 12/11/2015 0637   HDL 62 12/11/2015 0637   CHOLHDL 2.9 12/11/2015 0637   VLDL 17 12/11/2015 0637   LDLCALC 102* 12/11/2015 0637   HgbA1C  Lab Results  Component Value Date   HGBA1C 7.1* 12/11/2015   Urinalysis    Component Value Date/Time   COLORURINE AMBER* 12/13/2015 1821   APPEARANCEUR CLOUDY* 12/13/2015 1821   LABSPEC 1.026 12/13/2015 1821   PHURINE 5.0 12/13/2015 1821   GLUCOSEU 100* 12/13/2015 1821   HGBUR MODERATE* 12/13/2015 1821   BILIRUBINUR NEGATIVE 12/13/2015 1821   KETONESUR 15* 12/13/2015 1821    PROTEINUR 30* 12/13/2015 1821   UROBILINOGEN 0.2 09/26/2014 1607   NITRITE POSITIVE* 12/13/2015 1821   LEUKOCYTESUR NEGATIVE 12/13/2015 1821     SIGNIFICANT DIAGNOSTIC STUDIES Ct Head Wo Contrast 12/10/2015  Atrophy with small vessel chronic ischemic changes of deep cerebral white matter. No acute intracranial abnormalities.   Dg Chest Portable 1 View 12/13/2015 Minimal right basilar atelectasis. 12/10/2015  No active disease.   Mri and Mra Brain Wo Contrast 12/11/2015  IMPRESSION: Acute infarct right pons Atrophy and chronic microvascular ischemia. Signal loss distal right vertebral artery may be due to atherosclerotic disease and may be a cause of the acute pontine infarct. There is tortuosity which may be also contributing to signal loss in distal right vertebral artery. Left vertebral artery does not appear to contribute to the basilar. If further arterial information is felt necessary, consider CTA of the head.   Carotid Ultrasound - Bilateral: 1-39% ICA stenosis. Right: Unable to insonate the vertebral artery. Left: Vertebral artery flow is antegrade.  2D echocardiogram - Left ventricle: The cavity size was normal. There was moderateconcentric hypertrophy. Systolic function was vigorous. Theestimated ejection fraction was in the range of 65% to 70%. Wallmotion was normal; there were no regional wall motionabnormalities. Features are consistent with a pseudonormal leftventricular filling pattern, with concomitant abnormal relaxationand increased filling pressure (grade 2 diastolic dysfunction).     HISTORY OF PRESENT ILLNESS Gloria Velasquez is an 82 y.o. female was found to have slurred speech and left-sided weakness at 10 AM 12/10/2015 (LKW) while eating at the memory care unit where she resides and had trouble walking. Patient is unable to provide history and family cannot be reached at this point. The stroke nurse spoke to the nursing staff at the memory care unit on  noticed that the patient was fine at 8 AM and was walking his usual. At 10 AM the noticed that while she was eating she developed some speech difficulties and left-sided weakness. This has persisted today and transfer [facility] EMS to [facility]. Patient apparently has no known prior history of strokes or TIAs but has history of Alzheimer's dementia for the last 2-3 years. She is ambulating independently and is able to feed herself and carried out conversation at baseline. There was no witnessed  seizure activity by EMS. Blood sugar was found to be to 220 mg percent. Patient was administered IV TPA 12/10/2015 at 1247. She was not an intervention candidate secondary to baseline dementia, high mRS and inability to reach family. She was admitted to the neuro ICU for further evaluation and treatment.    HOSPITAL COURSE [name] is a 82 y.o. female with history of Alzheimer's dementia, hypertension, hyperlipidemia and diabetes presenting with slurred speech and left-sided weakness, difficulty walking. She received IV t-PA but was not a candidate for intervention. She stayed in the ICU overnight. She was found to have a R pontine infarct secondary to small vessel disease. She developed a temperature of 100.8 with no source found. Lisinopril was added to amlodipine for hypertension, continued on home statin. Patient had episodes of lethargy, decreased responsiveness in hospital, etiology unclear. Likely related to baseline dementia. Patient was hydrated and returned to skill nursing facility for rehabilitation.  Stroke: Right pontine acute infarct s/p IV tPA, likely secondary to small vessel disease source  Resultant Left hemiparesis  MRI Right pontine infarct  MRA Right VA signal loss, slow flow vs. occlusion   Carotid Doppler R VA not visualized  2D Echo EF 60-65%, no SOE  LDL 102  HgbA1c 7.1  aspirin 81 mg daily prior to admission, increased to aspirin 325 mg daily for  secondary stroke prevention  Ongoing aggressive stroke risk factor management  Therapy recommendations: SNF   Disposition: SNF  Fever/lethargy  Spiked temp 100.8 x 1, remains normal since  WBC normal. Glu 146  UA pos nitrate, many bacteria, but 0-5 WBC  CXR minimal R atx  Treated with IVF  Hypertension  On the high side  On amlodipine 10mg  prior to admission  Added lisinopril 20mg  daily  Goal BP 130/80  Hyperlipidemia  Home meds: lipitor 10, resumed in hospital  LDL 102, goal < 70  Continue statin at discharge  Diabetes type II  HgbA1c 7.1, goal < 7.0  On diabeta and metformin  Controlled  Other Stroke Risk Factors   Advanced age  Other Active Problems  Alzheimer's disease - on aricept  ? Legally blind, posted in her room, though family was not aware   DISCHARGE EXAM Blood pressure 160/73, pulse 91, temperature 99.4 F (37.4 C), temperature source Axillary, resp. rate 16, height 5' 1.81" (1.57 m), weight 65 kg (143 lb 4.8 oz), SpO2 100 %. General - Well nourished, well developed, not in . Ophthalmologic - Fundi not visualized due to noncooperation. Cardiovascular - Regular rate and rhythm. Neuro - awake, alert, eyes open, not orientated to time, place or People. Not following commands. Speech is dysarthric but able to understand. Eyes in the middle position. Blinks to threat bilaterally. PERRL. Minimum left lower facial asymmetry. Tongue midline. Left UE 3/5 with drift and left LE not cooperative on exam, but barely against gravity on pain. RUE moving freely, RLE against gravity on pain stimulation. Sensation appears preserved bilaterally. Not cooperative on babinski testing. Gait was not tested.   Discharge Diet   DIET SOFT Room service appropriate?: Yes; Fluid consistency:: Thin liquids  DISCHARGE PLAN  Disposition:  Discharge to skilled nursing facility for ongoing PT, OT and ST.    aspirin 325 mg daily for secondary stroke  prevention.  Follow-up No primary care provider on file. in 2 weeks.  Follow-up with Dr. Marvel Plan, Stroke Clinic in 2 months.  45 minutes were spent preparing discharge.  Rhoderick Moody [facility] See Amion  for Pager information 12/17/2015 2:57 PM    I, the attending vascular neurologist, have personally obtained a history, examined the patient, evaluated laboratory data, individually viewed imaging studies and agree with radiology interpretations. Together with the NP/PA, we formulated the assessment and plan of care which reflects our mutual decision.  I have made any additions or clarifications directly to the above note and agree with the findings and plan as currently documented.    Marvel Plan, MD PhD Stroke Neurology 12/17/2015 10:42 PM

## 2015-12-17 NOTE — Care Management Note (Signed)
Case Management Note  Patient Details  Name: Gloria Velasquez MRN: [id number] Date of Birth: [birth year]  Subjective/Objective:                    Action/Plan: Plan is for patient to discharge to [facility] care today. No further needs per CM.   Expected Discharge Date:                  Expected Discharge Plan:  Assisted Living / Rest Home  In-House Referral:  Clinical Social Work  Discharge planning Services  CM Consult  Post Acute Care Choice:    Choice offered to:     DME Arranged:    DME Agency:     HH Arranged:    HH Agency:     Status of Service:  In process, will continue to follow  Medicare Important Message Given:  Yes Date Medicare IM Given:    Medicare IM give by:    Date Additional Medicare IM Given:    Additional Medicare Important Message give by:     If discussed at Long Length of Stay Meetings, dates discussed:    Additional Comments:  Kermit Balo, RN 12/17/2015, 12:00 PM

## 2015-12-17 NOTE — Progress Notes (Signed)
Pt for discharge to SNF. Discharge orders received. IV dcd. Transported to [facility] at [employer] by [employer].

## 2015-12-17 NOTE — Clinical Social Work Note (Signed)
BSW intern has arranged transport for patient to d/c to [facility]. Facility, family, and patient's bedside nurse  have been notified.  BSW intern is signing off. If any further Social Work needs arises, please re-consult.  [employer] intern [phone]

## 2015-12-17 NOTE — Care Management Important Message (Signed)
Important Message  Patient Details  Name: Gloria Velasquez MRN: [id number] Date of Birth: [birth year]   Medicare Important Message Given:  Yes    Oralia Rud Annett Boxwell 12/17/2015, 12:07 PM

## 2015-12-17 NOTE — Progress Notes (Signed)
Initial Nutrition Assessment  INTERVENTION:  Provide Ensure Enlive po BID, each supplement provides 350 kcal and 20 grams of protein Provide Multivitamin with minerals daily   NUTRITION DIAGNOSIS:   Inadequate oral intake related to acute illness as evidenced by meal completion < 25%.   GOAL:   Patient will meet greater than or equal to 90% of their needs   MONITOR:   PO intake, Supplement acceptance, Labs, Weight trends, Skin, I & O's  REASON FOR ASSESSMENT:   Low Braden    ASSESSMENT:   82 y.o. female with history of Alzheimer's dementia, hypertension, hyperlipidemia and diabetes presenting with slurred speech and left-sided weakness, difficulty walking.   Per nursing notes, pt pocketed food this morning and would not swallow. Per RN, pt did not eat anything over the weekend. Per MD note, pt has not been eating or drinking much and son reported that patient would not eat well PTA if no one was eating with her. Pt asleep at time of visit. Mild muscle wasting noted in clavicles and temples. She had an episode of hypoglycemia yesterday.   Labs: low hemoglobin, glucose ranging 61 to 177 mg/dL  Diet Order:  DIET SOFT Room service appropriate?: Yes; Fluid consistency:: Thin  Skin:  Reviewed, no issues  Last BM:  2/13  Height:   Ht Readings from Last 1 Encounters:  12/12/15 5' 1.81" (1.57 m)    Weight:   Wt Readings from Last 1 Encounters:  12/10/15 143 lb 4.8 oz (65 kg)    Ideal Body Weight:  50 kg  BMI:  Body mass index is 26.37 kg/(m^2).  Estimated Nutritional Needs:   Kcal:  1500-1700  Protein:  70-80 grams  Fluid:  1.7 L/day  EDUCATION NEEDS:   No education needs identified at this time  Dorothea Ogle RD, LDN Inpatient Clinical Dietitian Pager: [phone] After Hours Pager: [phone]

## 2015-12-17 NOTE — Clinical Social Work Placement (Signed)
   CLINICAL SOCIAL WORK PLACEMENT  NOTE  Date:  12/17/2015  Patient Details  Name: Gloria Velasquez MRN: [id number] Date of Birth: 12-10-1921  Clinical Social Work is seeking post-discharge placement for this patient at the Skilled  Nursing Facility level of care (*CSW will initial, date and re-position this form in  chart as items are completed):  Yes   Patient/family provided with [place] Clinical Social Work Department's list of facilities offering this level of care within the geographic area requested by the patient (or if unable, by the patient's family).  Yes   Patient/family informed of their freedom to choose among providers that offer the needed level of care, that participate in Medicare, Medicaid or managed care program needed by the patient, have an available bed and are willing to accept the patient.  Yes   Patient/family informed of [facility]'s ownership interest in [facility] and [facility], as well as of the fact that they are under no obligation to receive care at these facilities.  PASRR submitted to EDS on 12/16/15     PASRR number received on 12/16/15     Existing PASRR number confirmed on       FL2 transmitted to all facilities in geographic area requested by pt/family on 12/15/15     FL2 transmitted to all facilities within larger geographic area on       Patient informed that his/her managed care company has contracts with or will negotiate with certain facilities, including the following:        Yes   Patient/family informed of bed offers received.  Patient chooses bed at [facility]     Physician recommends and patient chooses bed at      Patient to be transferred to   on  .  Patient to be transferred to facility by       Patient family notified on   of transfer.  Name of family member notified:        PHYSICIAN       Additional Comment:    _______________________________________________ Darleene Cleaver,  LCSWA 12/17/2015, 10:47 AM

## 2015-12-23 ENCOUNTER — Emergency Department (HOSPITAL_COMMUNITY): Payer: Medicare Other

## 2015-12-23 ENCOUNTER — Encounter (HOSPITAL_COMMUNITY): Payer: Self-pay | Admitting: *Deleted

## 2015-12-23 ENCOUNTER — Inpatient Hospital Stay (HOSPITAL_COMMUNITY)
Admission: EM | Admit: 2015-12-23 | Discharge: 2015-12-25 | DRG: 689 | Disposition: A | Discharge status: Skilled Nursing Facility | Payer: Medicare Other | Attending: Internal Medicine | Admitting: Internal Medicine

## 2015-12-23 DIAGNOSIS — F0280 Dementia in other diseases classified elsewhere without behavioral disturbance: Secondary | ICD-10-CM | POA: Diagnosis present

## 2015-12-23 DIAGNOSIS — E118 Type 2 diabetes mellitus with unspecified complications: Secondary | ICD-10-CM | POA: Diagnosis not present

## 2015-12-23 DIAGNOSIS — L8990 Pressure ulcer of unspecified site, unspecified stage: Secondary | ICD-10-CM | POA: Diagnosis present

## 2015-12-23 DIAGNOSIS — E870 Hyperosmolality and hypernatremia: Secondary | ICD-10-CM | POA: Diagnosis present

## 2015-12-23 DIAGNOSIS — R600 Localized edema: Secondary | ICD-10-CM | POA: Diagnosis present

## 2015-12-23 DIAGNOSIS — I82619 Acute embolism and thrombosis of superficial veins of unspecified upper extremity: Secondary | ICD-10-CM | POA: Diagnosis present

## 2015-12-23 DIAGNOSIS — I639 Cerebral infarction, unspecified: Secondary | ICD-10-CM

## 2015-12-23 DIAGNOSIS — G309 Alzheimer's disease, unspecified: Secondary | ICD-10-CM | POA: Diagnosis present

## 2015-12-23 DIAGNOSIS — R29810 Facial weakness: Secondary | ICD-10-CM | POA: Diagnosis present

## 2015-12-23 DIAGNOSIS — I82B19 Acute embolism and thrombosis of unspecified subclavian vein: Secondary | ICD-10-CM | POA: Diagnosis present

## 2015-12-23 DIAGNOSIS — G9340 Encephalopathy, unspecified: Secondary | ICD-10-CM | POA: Diagnosis not present

## 2015-12-23 DIAGNOSIS — I10 Essential (primary) hypertension: Secondary | ICD-10-CM | POA: Diagnosis present

## 2015-12-23 DIAGNOSIS — F0281 Dementia in other diseases classified elsewhere with behavioral disturbance: Secondary | ICD-10-CM

## 2015-12-23 DIAGNOSIS — R4182 Altered mental status, unspecified: Secondary | ICD-10-CM

## 2015-12-23 DIAGNOSIS — I5032 Chronic diastolic (congestive) heart failure: Secondary | ICD-10-CM | POA: Diagnosis present

## 2015-12-23 DIAGNOSIS — E860 Dehydration: Secondary | ICD-10-CM | POA: Diagnosis present

## 2015-12-23 DIAGNOSIS — E7800 Pure hypercholesterolemia, unspecified: Secondary | ICD-10-CM | POA: Diagnosis present

## 2015-12-23 DIAGNOSIS — N390 Urinary tract infection, site not specified: Secondary | ICD-10-CM | POA: Diagnosis not present

## 2015-12-23 DIAGNOSIS — E119 Type 2 diabetes mellitus without complications: Secondary | ICD-10-CM | POA: Diagnosis present

## 2015-12-23 DIAGNOSIS — G301 Alzheimer's disease with late onset: Secondary | ICD-10-CM

## 2015-12-23 DIAGNOSIS — Z8249 Family history of ischemic heart disease and other diseases of the circulatory system: Secondary | ICD-10-CM

## 2015-12-23 DIAGNOSIS — Z8673 Personal history of transient ischemic attack (TIA), and cerebral infarction without residual deficits: Secondary | ICD-10-CM

## 2015-12-23 DIAGNOSIS — E878 Other disorders of electrolyte and fluid balance, not elsewhere classified: Secondary | ICD-10-CM | POA: Diagnosis present

## 2015-12-23 DIAGNOSIS — R609 Edema, unspecified: Secondary | ICD-10-CM

## 2015-12-23 DIAGNOSIS — B9789 Other viral agents as the cause of diseases classified elsewhere: Secondary | ICD-10-CM | POA: Diagnosis present

## 2015-12-23 LAB — COMPREHENSIVE METABOLIC PANEL
ALBUMIN: 2.4 g/dL — AB (ref 3.5–5.0)
ALK PHOS: 63 U/L (ref 38–126)
ALT: 15 U/L (ref 14–54)
AST: 13 U/L — AB (ref 15–41)
Anion gap: 12 (ref 5–15)
BUN: 18 mg/dL (ref 6–20)
CALCIUM: 9.7 mg/dL (ref 8.9–10.3)
CO2: 26 mmol/L (ref 22–32)
CREATININE: 0.96 mg/dL (ref 0.44–1.00)
Chloride: 116 mmol/L — ABNORMAL HIGH (ref 101–111)
GFR calc Af Amer: 58 mL/min — ABNORMAL LOW (ref >60)
GFR calc non Af Amer: 50 mL/min — ABNORMAL LOW (ref >60)
GLUCOSE: 203 mg/dL — AB (ref 65–99)
Potassium: 3.5 mmol/L (ref 3.5–5.1)
SODIUM: 154 mmol/L — AB (ref 135–145)
Total Bilirubin: 0.4 mg/dL (ref 0.3–1.2)
Total Protein: 6.5 g/dL (ref 6.5–8.1)

## 2015-12-23 LAB — GLUCOSE, CAPILLARY: GLUCOSE-CAPILLARY: 155 mg/dL — AB (ref 65–99)

## 2015-12-23 LAB — CBC WITH DIFFERENTIAL/PLATELET
BASOS PCT: 0 %
Basophils Absolute: 0 10*3/uL (ref 0.0–0.1)
EOS ABS: 0.1 10*3/uL (ref 0.0–0.7)
Eosinophils Relative: 1 %
HEMATOCRIT: 35.7 % — AB (ref 36.0–46.0)
Hemoglobin: 11.1 g/dL — ABNORMAL LOW (ref 12.0–15.0)
Lymphocytes Relative: 17 %
Lymphs Abs: 1.2 10*3/uL (ref 0.7–4.0)
MCH: 28.6 pg (ref 26.0–34.0)
MCHC: 31.1 g/dL (ref 30.0–36.0)
MCV: 92 fL (ref 78.0–100.0)
MONO ABS: 0.4 10*3/uL (ref 0.1–1.0)
MONOS PCT: 5 %
Neutro Abs: 5.7 10*3/uL (ref 1.7–7.7)
Neutrophils Relative %: 77 %
Platelets: 299 10*3/uL (ref 150–400)
RBC: 3.88 MIL/uL (ref 3.87–5.11)
RDW: 12.8 % (ref 11.5–15.5)
WBC: 7.4 10*3/uL (ref 4.0–10.5)

## 2015-12-23 LAB — URINALYSIS, ROUTINE W REFLEX MICROSCOPIC
Glucose, UA: 250 mg/dL — AB
Ketones, ur: NEGATIVE mg/dL
NITRITE: POSITIVE — AB
Protein, ur: 30 mg/dL — AB
SPECIFIC GRAVITY, URINE: 1.029 (ref 1.005–1.030)
pH: 5.5 (ref 5.0–8.0)

## 2015-12-23 LAB — URINE MICROSCOPIC-ADD ON

## 2015-12-23 MED ORDER — AMLODIPINE BESYLATE 10 MG PO TABS
10.0000 mg | ORAL_TABLET | Freq: Every day | ORAL | Status: DC
  Administered 2015-12-24 – 2015-12-25 (×2): 10 mg via ORAL
  Filled 2015-12-23 (×2): qty 1

## 2015-12-23 MED ORDER — ACETAMINOPHEN 325 MG PO TABS
650.0000 mg | ORAL_TABLET | Freq: Four times a day (QID) | ORAL | Status: DC | PRN
  Filled 2015-12-23: qty 2

## 2015-12-23 MED ORDER — ONDANSETRON HCL 4 MG/2ML IJ SOLN: 4.0000 mg | Freq: Four times a day (QID) | INTRAMUSCULAR | Status: DC | PRN

## 2015-12-23 MED ORDER — DEXTROSE 5 % IV SOLN
1.0000 g | Freq: Once | INTRAVENOUS | Status: AC
  Administered 2015-12-23: 1 g via INTRAVENOUS
  Filled 2015-12-23: qty 10

## 2015-12-23 MED ORDER — POLYETHYLENE GLYCOL 3350 17 G PO PACK
17.0000 g | PACK | Freq: Every day | ORAL | Status: DC
  Administered 2015-12-24 – 2015-12-25 (×2): 17 g via ORAL
  Filled 2015-12-23 (×2): qty 1

## 2015-12-23 MED ORDER — VITAMIN D 1000 UNITS PO TABS
1000.0000 [IU] | ORAL_TABLET | Freq: Every day | ORAL | Status: DC
  Administered 2015-12-24 – 2015-12-25 (×2): 1000 [IU] via ORAL
  Filled 2015-12-23 (×2): qty 1

## 2015-12-23 MED ORDER — SODIUM CHLORIDE 0.9 % IV BOLUS (SEPSIS)
500.0000 mL | Freq: Once | INTRAVENOUS | Status: AC
  Administered 2015-12-23: 500 mL via INTRAVENOUS

## 2015-12-23 MED ORDER — ASPIRIN EC 325 MG PO TBEC
325.0000 mg | DELAYED_RELEASE_TABLET | Freq: Every day | ORAL | Status: DC
  Administered 2015-12-24: 325 mg via ORAL
  Filled 2015-12-23: qty 1

## 2015-12-23 MED ORDER — INSULIN ASPART 100 UNIT/ML ~~LOC~~ SOLN
0.0000 [IU] | Freq: Three times a day (TID) | SUBCUTANEOUS | Status: DC
  Administered 2015-12-24: 1 [IU] via SUBCUTANEOUS
  Administered 2015-12-24 (×2): 2 [IU] via SUBCUTANEOUS
  Administered 2015-12-25: 3 [IU] via SUBCUTANEOUS
  Administered 2015-12-25: 2 [IU] via SUBCUTANEOUS

## 2015-12-23 MED ORDER — ENOXAPARIN SODIUM 30 MG/0.3ML ~~LOC~~ SOLN
30.0000 mg | SUBCUTANEOUS | Status: DC
  Administered 2015-12-23 – 2015-12-24 (×2): 30 mg via SUBCUTANEOUS
  Filled 2015-12-23 (×2): qty 0.3

## 2015-12-23 MED ORDER — SODIUM CHLORIDE 0.9 % IV SOLN
INTRAVENOUS | Status: DC
  Administered 2015-12-23 – 2015-12-25 (×2): via INTRAVENOUS

## 2015-12-23 MED ORDER — ATORVASTATIN CALCIUM 10 MG PO TABS
10.0000 mg | ORAL_TABLET | Freq: Every day | ORAL | Status: DC
  Administered 2015-12-24: 10 mg via ORAL
  Filled 2015-12-23: qty 1

## 2015-12-23 MED ORDER — SENNA 8.6 MG PO TABS
1.0000 | ORAL_TABLET | Freq: Every day | ORAL | Status: DC
  Administered 2015-12-24 – 2015-12-25 (×2): 8.6 mg via ORAL
  Filled 2015-12-23 (×2): qty 1

## 2015-12-23 MED ORDER — IOHEXOL 300 MG/ML  SOLN
80.0000 mL | Freq: Once | INTRAMUSCULAR | Status: AC | PRN
  Administered 2015-12-23: 80 mL via INTRAVENOUS

## 2015-12-23 MED ORDER — ACETAMINOPHEN 325 MG PO TABS: 650.0000 mg | ORAL_TABLET | Freq: Four times a day (QID) | ORAL | Status: DC | PRN

## 2015-12-23 MED ORDER — ENSURE ENLIVE PO LIQD
237.0000 mL | Freq: Two times a day (BID) | ORAL | Status: DC
  Administered 2015-12-24: 237 mL via ORAL

## 2015-12-23 MED ORDER — LISINOPRIL 20 MG PO TABS
20.0000 mg | ORAL_TABLET | Freq: Every day | ORAL | Status: DC
  Administered 2015-12-24 – 2015-12-25 (×2): 20 mg via ORAL
  Filled 2015-12-23 (×2): qty 1

## 2015-12-23 MED ORDER — CEFTRIAXONE SODIUM 1 G IJ SOLR
1.0000 g | INTRAMUSCULAR | Status: DC
  Administered 2015-12-24: 1 g via INTRAVENOUS
  Filled 2015-12-23 (×2): qty 10

## 2015-12-23 MED ORDER — ONDANSETRON HCL 4 MG PO TABS: 4.0000 mg | ORAL_TABLET | Freq: Four times a day (QID) | ORAL | Status: DC | PRN

## 2015-12-23 MED ORDER — DONEPEZIL HCL 10 MG PO TABS
10.0000 mg | ORAL_TABLET | Freq: Every day | ORAL | Status: DC
  Filled 2015-12-23: qty 1

## 2015-12-23 MED ORDER — ACETAMINOPHEN 650 MG RE SUPP: 650.0000 mg | Freq: Four times a day (QID) | RECTAL | Status: DC | PRN

## 2015-12-23 MED ORDER — FERROUS SULFATE 325 (65 FE) MG PO TABS
325.0000 mg | ORAL_TABLET | Freq: Every day | ORAL | Status: DC
  Administered 2015-12-24 – 2015-12-25 (×2): 325 mg via ORAL
  Filled 2015-12-23 (×2): qty 1

## 2015-12-23 MED ORDER — LORAZEPAM 2 MG/ML IJ SOLN
1.0000 mg | Freq: Once | INTRAMUSCULAR | Status: AC
Start: 2015-12-23 — End: 2015-12-23
  Administered 2015-12-23: 1 mg via INTRAVENOUS
  Filled 2015-12-23: qty 1

## 2015-12-23 NOTE — Progress Notes (Signed)
ANTIBIOTIC CONSULT NOTE - INITIAL  Pharmacy Consult for Ceftriaxone Indication: UTI  No Known Allergies  Patient Measurements:   Adjusted Body Weight:   Vital Signs: Temp: 99.2 F (37.3 C) (02/22 1440) Temp Source: Rectal (02/22 1440) BP: 136/67 mmHg (02/22 1815) Pulse Rate: 56 (02/22 1815) Intake/Output from previous day:   Intake/Output from this shift:    Labs:  Recent Labs  12/23/15 1445  WBC 7.4  HGB 11.1*  PLT 299  CREATININE 0.96   Estimated Creatinine Clearance: 32.9 mL/min (by C-G formula based on Cr of 0.96). No results for input(s): VANCOTROUGH, VANCOPEAK, VANCORANDOM, GENTTROUGH, GENTPEAK, GENTRANDOM, TOBRATROUGH, TOBRAPEAK, TOBRARND, AMIKACINPEAK, AMIKACINTROU, AMIKACIN in the last 72 hours.   Microbiology: Recent Results (from the past 720 hour(s))  MRSA PCR Screening     Status: None   Collection Time: 12/10/15  2:06 PM  Result Value Ref Range Status   MRSA by PCR NEGATIVE NEGATIVE Final    Comment:        The GeneXpert MRSA Assay (FDA approved for NASAL specimens only), is one component of a comprehensive MRSA colonization surveillance program. It is not intended to diagnose MRSA infection nor to guide or monitor treatment for MRSA infections.     Medical History: Past Medical History  Diagnosis Date  . Microalbuminuria   . Pyuria   . Dermatitis   . Dementia   . Diabetes mellitus without complication (HCC)   . Hyperlipidemia   . Hypertension     Medications:  Scheduled:  . amLODipine  10 mg Oral Daily  . aspirin EC  325 mg Oral Daily  . atorvastatin  10 mg Oral Daily  . cholecalciferol  1,000 Units Oral Daily  . donepezil  10 mg Oral QHS  . enoxaparin (LOVENOX) injection  40 mg Subcutaneous Q24H  . [START ON 12/24/2015] feeding supplement (ENSURE ENLIVE)  237 mL Oral BID BM  . [START ON 12/24/2015] ferrous sulfate  325 mg Oral Q breakfast  . [START ON 12/24/2015] insulin aspart  0-9 Units Subcutaneous TID WC  . lisinopril  20  mg Oral Daily  . polyethylene glycol  17 g Oral Daily  . senna  1 tablet Oral Daily   Assessment: 82yo with AMS, weakness, poor appetite, and (+)UA.  Pharmacy consulted to dose Ceftriaxone, first dose was given at ~4PM.    Cr 0.96 WBC 7.4 Urine cx (p)  Goal of Therapy:  treatment of UTI  Plan:  Continue Ceftriaxone 1g IV q24, next dose on 2/23  Pharmacy will sign-off as no adjustments necessary; please re-consult if needed.  Marisue Humble, PharmD Clinical Pharmacist [facility]

## 2015-12-23 NOTE — ED Notes (Signed)
Attempted report to 5W. 

## 2015-12-23 NOTE — H&P (Addendum)
[facility] History and Physical  Gloria Velasquez VFI:[id number] DOB: 04/03/1922 DOA: 12/23/2015  Referring physician: Dr. Alvira Monday, EDP PCP: No primary care provider on file.  Specialists:   Chief Complaint: Altered mental status  HPI: Gloria Velasquez is a 82 y.o. female  With recent history of stroke on 12/10/2015, received TPA, diabetes mellitus, hypertension that presented to the emergency department from [facility] for altered mental status. Per family, over the last several days patient has had generalized weakness, poor appetite, and has not been very alert. In the emergency department, CT of the head was negative acute abnormalities. Chest x-ray was also unremarkable. Patient did have positive UA. Patient was given 1 dose of ceftriaxone. Sodium and chloride were both elevated. TR[facility] called for admission.  Review of Systems:  Unable to obtain from patient due to her current state  Past Medical History  Diagnosis Date  . Microalbuminuria   . Pyuria   . Dermatitis   . Dementia   . Diabetes mellitus without complication (HCC)   . Hyperlipidemia   . Hypertension    History reviewed. No pertinent past surgical history. Social History:  reports that she has never smoked. She does not have any smokeless tobacco history on file. She reports that she does not drink alcohol or use illicit drugs. From [facility]  No Known Allergies  Family History  Hypertension  Prior to Admission medications   Medication Sig Start Date End Date Taking? Authorizing Provider  amLODipine (NORVASC) 10 MG tablet Take 10 mg by mouth daily.   Yes Historical Provider, MD  aspirin EC 325 MG EC tablet Take 1 tablet (325 mg total) by mouth daily. 12/17/15  Yes Layne Benton, NP  atorvastatin (LIPITOR) 10 MG tablet Take 1 tablet (10 mg total) by mouth daily. 12/17/15  Yes Layne Benton, NP  cholecalciferol (VITAMIN D) 1000 UNITS tablet Take 1,000 Units by mouth daily.   Yes  Historical Provider, MD  donepezil (ARICEPT) 10 MG tablet Take 10 mg by mouth at bedtime.   Yes Historical Provider, MD  feeding supplement, ENSURE ENLIVE, (ENSURE ENLIVE) LIQD Take 237 mLs by mouth 2 (two) times daily between meals. 12/17/15  Yes Layne Benton, NP  ferrous sulfate 325 (65 FE) MG tablet Take 325 mg by mouth daily with breakfast.   Yes Historical Provider, MD  lisinopril (PRINIVIL,ZESTRIL) 20 MG tablet Take 1 tablet (20 mg total) by mouth daily. 12/17/15  Yes Layne Benton, NP  metFORMIN (GLUCOPHAGE) 1000 MG tablet Take 1,000 mg by mouth 2 (two) times daily with a meal.   Yes Historical Provider, MD  polyethylene glycol (MIRALAX / GLYCOLAX) packet Take 17 g by mouth daily.   Yes Historical Provider, MD  senna (SENOKOT) 8.6 MG TABS tablet Take 1 tablet by mouth daily.   Yes Historical Provider, MD  acetaminophen (TYLENOL) 325 MG tablet Take 650 mg by mouth every 6 (six) hours as needed for mild pain or moderate pain.    Historical Provider, MD   Physical Exam: Filed Vitals:   12/23/15 1440 12/23/15 1500  BP: 158/95 161/80  Pulse: 68 69  Temp: 99.2 F (37.3 C)   Resp: 20 17     General: Well developed, well nourished, NAD, appears stated age  HEENT: NCAT, PERRLA, EOMI, Anicteic Sclera, mucous membranes dry.   Neck: Supple, no JVD, no masses  Cardiovascular: S1 S2 auscultated, no rubs, murmurs or gallops. Regular rate and rhythm.  Respiratory: Clear to auscultation bilaterally  with equal chest rise  Abdomen: Soft, nontender, nondistended, + bowel sounds  Extremities: warm dry without cyanosis clubbing or edema in LE.  LUE +edema  Neuro: Unable to assess at this time  Skin: Without rashes exudates or nodules  Psych: Unable to assess at this time  Labs on Admission:  Basic Metabolic Panel:  Recent Labs Lab 12/17/15 0650 12/23/15 1445  NA 145 154*  K 3.5 3.5  CL 110 116*  CO2 24 26  GLUCOSE 83 203*  BUN 16 18  CREATININE 0.90 0.96  CALCIUM 9.4 9.7    Liver Function Tests:  Recent Labs Lab 12/23/15 1445  AST 13*  ALT 15  ALKPHOS 63  BILITOT 0.4  PROT 6.5  ALBUMIN 2.4*   No results for input(s): LIPASE, AMYLASE in the last 168 hours. No results for input(s): AMMONIA in the last 168 hours. CBC:  Recent Labs Lab 12/17/15 0650 12/23/15 1445  WBC 6.7 7.4  NEUTROABS  --  5.7  HGB 10.6* 11.1*  HCT 31.7* 35.7*  MCV 89.5 92.0  PLT 207 299   Cardiac Enzymes: No results for input(s): CKTOTAL, CKMB, CKMBINDEX, TROPONINI in the last 168 hours.  BNP (last 3 results) No results for input(s): BNP in the last 8760 hours.  ProBNP (last 3 results) No results for input(s): PROBNP in the last 8760 hours.  CBG:  Recent Labs Lab 12/17/15 0626 12/17/15 1132  GLUCAP 78 79    Radiological Exams on Admission: Dg Chest 1 View  12/23/2015  CLINICAL DATA:  Disorientation and lethargy EXAM: CHEST 1 VIEW COMPARISON:  December 13, 2015 FINDINGS: There is no edema or consolidation. Heart is upper normal in size with pulmonary vascularity within normal limits. No adenopathy. No bone lesions. There is atherosclerotic calcification in the aorta. IMPRESSION: No edema or consolidation. Electronically Signed   By: Bretta Bang III M.D.   On: 12/23/2015 15:25   Ct Head Wo Contrast  12/23/2015  CLINICAL DATA:  Right facial droop. Lethargy. Weakness since 3 days ago. EXAM: CT HEAD WITHOUT CONTRAST TECHNIQUE: Contiguous axial images were obtained from the base of the skull through the vertex without intravenous contrast. COMPARISON:  Brain MR dated 12/11/2015 and head CT dated 12/10/2015. FINDINGS: Diffusely enlarged ventricles and subarachnoid spaces. Patchy white matter low density in both cerebral hemispheres. Old left alignment lacunar infarct. Small pontine lacunar infarcts. No intracranial hemorrhage, mass lesion or CT evidence of acute infarction. Unremarkable bones. Mild right sphenoid and right maxillary sinus mucosal thickening and small  right maxillary sinus retention cyst. IMPRESSION: 1. No acute abnormality. 2. Atrophy, chronic small vessel white matter ischemic changes and old lacunar infarcts. 3. Mild chronic sinusitis. Electronically Signed   By: Beckie Salts M.D.   On: 12/23/2015 17:11   Ct Abdomen Pelvis W Contrast  12/23/2015  CLINICAL DATA:  Generalized abdominal pain and distention. Altered mental status. EXAM: CT ABDOMEN AND PELVIS WITH CONTRAST TECHNIQUE: Multidetector CT imaging of the abdomen and pelvis was performed using the standard protocol following bolus administration of intravenous contrast. CONTRAST:  80mL OMNIPAQUE IOHEXOL 300 MG/ML  SOLN COMPARISON:  11/26/2013 FINDINGS: Lower chest:  No acute findings. Hepatobiliary: Several tiny hepatic cysts are again noted, however no liver masses are identified. Prior cholecystectomy noted. No evidence of biliary dilatation. Pancreas: No mass, inflammatory changes, or other significant abnormality. Spleen: Within normal limits in size and appearance. Adrenals/Urinary Tract: Adrenal glands are unremarkable. Small bilateral renal cysts are again noted. No evidence of renal masses or hydronephrosis. No  evidence of ureteral calculi or dilatation. Stomach/Bowel: Small hiatal hernia again noted. No evidence of obstruction, inflammatory process, or abnormal fluid collections. Vascular/Lymphatic: No pathologically enlarged lymph nodes. No evidence of abdominal aortic aneurysm. Aortic atherosclerotic plaque noted. Reproductive: Prior hysterectomy noted. Adnexal regions are unremarkable in appearance. Other: None. Musculoskeletal:  No suspicious bone lesions identified. IMPRESSION: No acute findings identified within the abdomen or pelvis. Small hiatal hernia. Electronically Signed   By: Myles Rosenthal M.D.   On: 12/23/2015 17:21    EKG: Independently reviewed. Sinus rhythm, rate 69  Assessment/Plan Acute encephalopathy -Patient be admitted for observation -Likely secondary to urinary  tract infection and decreased oral intake -CT head: No acute abnormality -CT abdomen and pelvis: No acute findings identified in the abdomen or pelvis, small hiatal hernia -Chest x-ray: No edema or consolidation -Will contact neuro checks every 4 hours continue to monitor patient closely  Urinary tract infection -UA showed WBC 6-30, many bacteria, small leukocytes, positive nitrites -Will place patient on ceftriaxone -Urine culture pending  Hypernatremia/hyperchloremia -Secondary to dehydration and decreased oral intake -Will place patient on gentle IV fluids and continue to monitor BMP  Left upper extremity edema -Patient supposedly had IV site placed at the nursing facility. -Will obtain ultrasound to rule out DVT  Chronic diastolic heart failure -Echocardiogram 12/11/2015 showed EF of 65-70%, grade 2 diastolic dysfunction -Continue to monitor intake and output, daily weight -Patient appears to be clinically dry  Recent CVA -Patient recently admitted 2 weeks ago for CVA and was discharged to [facility] -Per family, patient has not been herself since the stroke. -Continue aspirin, statin  Dementia -Continue Aricept  Diabetes mellitus, type II -Metformin held -Place patient on insulin sliding scale CBG monitoring -Hemoglobin A1c 7.1 on 12/11/2015  Essential hypertension -Continue amlodipine, lisinopril   DVT prophylaxis: Lovenox  Code Status: Full  Condition: Guarded   Family Communication: Family at bedside. Admission, patients condition and plan of care including tests being ordered have been discussed with the patient's family, who indicate understanding and agree with the plan and Code Status.  Disposition Plan: Admitted for observation  Time spent: 60 minutes  Rocklyn Mayberry D.O. [facility] Pager [phone]  If 7PM-7AM, please contact night-coverage [email or web address] Password [facility] 12/23/2015, 6:12 PM

## 2015-12-23 NOTE — Progress Notes (Signed)
NURSING PROGRESS NOTE  Gloria Velasquez [id number] Admission Data: 12/23/2015 8:11 PM Attending Provider: Edsel Petrin, DO PCP:No primary care provider on file. Code Status: Full code  Gloria Velasquez is a 82 y.o. female patient admitted from ED:  -No acute distress noted.  -No complaints of shortness of breath.  -No complaints of chest pain.   Blood pressure 136/67, pulse 56, temperature 99.2 F (37.3 C), temperature source Rectal, resp. rate 17, SpO2 97 %.   IV Fluids:  IV in place, occlusive dsg intact without redness, IV cath wrist right and antecubital right, condition patent and no redness none.   Allergies:  Review of patient's allergies indicates no known allergies.  Past Medical History:   has a past medical history of Microalbuminuria; Pyuria; Dermatitis; Dementia; Diabetes mellitus without complication (HCC); Hyperlipidemia; and Hypertension.  Past Surgical History:   has no past surgical history on file.  Social History:   reports that she has never smoked. She does not have any smokeless tobacco history on file. She reports that she does not drink alcohol or use illicit drugs.  Skin: Stage 2 sacrum  Patient/Family orientated to room. Information packet given to patient/family. Admission inpatient armband information verified with patient/family to include name and date of birth and placed on patient arm. Side rails up x 2, fall assessment and education completed with patient/family. Patient/family able to verbalize understanding of risk associated with falls and verbalized understanding to call for assistance before getting out of bed. Call light within reach. Patient/family able to voice and demonstrate understanding of unit orientation instructions.    Will continue to evaluate and treat per MD orders.

## 2015-12-23 NOTE — ED Provider Notes (Signed)
CSN: [id number]     Arrival date & time 12/23/15  1408 History   First MD Initiated Contact with Patient 12/23/15 1408     Chief Complaint  Patient presents with  . Facial Droop  . Weakness   Level V Caveat: Dementia   Gloria Velasquez is a 82 y.o. female who presents to the ED via EMS from SNF with family members who report the patient has been declining for the past several days. The patient was diagnosed with a right pontine stroke 12/10/15 and received TPA. They report she had improvement and was doing better and was sent to skilled nursing facility. Therefore after arrival to the skilled nursing facility 6 days ago she has had slow decline. They report increased left-sided weakness over the past 4 days. Report increased right-sided facial droop for 4 days. They report she has been more fatigued and not very alert. These were the same symptoms she was having with her stroke several days ago.  They report her speech is been more garbled. Patient does have dementia, but they report she usually is alert and will speak more clearly. They deny vomiting, diarrhea, changes to her urine or coughing.   Patient is a 82 y.o. female presenting with weakness. The history is provided by medical records, the EMS personnel, a relative and a caregiver. No language interpreter was used.  Weakness Associated symptoms include weakness. Pertinent negatives include no coughing, rash or vomiting.    Past Medical History  Diagnosis Date  . Microalbuminuria   . Pyuria   . Dermatitis   . Dementia   . Diabetes mellitus without complication (HCC)   . Hyperlipidemia   . Hypertension    History reviewed. No pertinent past surgical history. Family History  Problem Relation Age of Onset  . Hypertension     Social History  Substance Use Topics  . Smoking status: Never Smoker   . Smokeless tobacco: None  . Alcohol Use: No   OB History    No data available     Review of Systems  Unable to perform ROS:  Dementia  Respiratory: Negative for cough.   Gastrointestinal: Negative for vomiting and diarrhea.  Skin: Negative for rash.  Neurological: Positive for weakness.      Allergies  Review of patient's allergies indicates no known allergies.  Home Medications   Prior to Admission medications   Medication Sig Start Date End Date Taking? Authorizing Provider  amLODipine (NORVASC) 10 MG tablet Take 10 mg by mouth daily.   Yes Historical Provider, MD  aspirin EC 325 MG EC tablet Take 1 tablet (325 mg total) by mouth daily. 12/17/15  Yes Layne Benton, NP  atorvastatin (LIPITOR) 10 MG tablet Take 1 tablet (10 mg total) by mouth daily. 12/17/15  Yes Layne Benton, NP  cholecalciferol (VITAMIN D) 1000 UNITS tablet Take 1,000 Units by mouth daily.   Yes Historical Provider, MD  donepezil (ARICEPT) 10 MG tablet Take 10 mg by mouth at bedtime.   Yes Historical Provider, MD  feeding supplement, ENSURE ENLIVE, (ENSURE ENLIVE) LIQD Take 237 mLs by mouth 2 (two) times daily between meals. 12/17/15  Yes Layne Benton, NP  ferrous sulfate 325 (65 FE) MG tablet Take 325 mg by mouth daily with breakfast.   Yes Historical Provider, MD  lisinopril (PRINIVIL,ZESTRIL) 20 MG tablet Take 1 tablet (20 mg total) by mouth daily. 12/17/15  Yes Layne Benton, NP  metFORMIN (GLUCOPHAGE) 1000 MG tablet Take 1,000 mg by  mouth 2 (two) times daily with a meal.   Yes Historical Provider, MD  polyethylene glycol (MIRALAX / GLYCOLAX) packet Take 17 g by mouth daily.   Yes Historical Provider, MD  senna (SENOKOT) 8.6 MG TABS tablet Take 1 tablet by mouth daily.   Yes Historical Provider, MD  acetaminophen (TYLENOL) 325 MG tablet Take 650 mg by mouth every 6 (six) hours as needed for mild pain or moderate pain.    Historical Provider, MD   BP 136/67 mmHg  Pulse 56  Temp(Src) 99.2 F (37.3 C) (Rectal)  Resp 17  SpO2 97% Physical Exam  Constitutional: She appears well-developed and well-nourished. No distress.  Will arouse  with light stimulation. Patient sleepy.   HENT:  Head: Normocephalic and atraumatic.  Right Ear: External ear normal.  Left Ear: External ear normal.  Right facial droop.  Mucous membranes appear slightly dry. Bilateral tympanic membranes are pearly-gray without erythema or loss of landmarks.   Eyes: Conjunctivae are normal. Pupils are equal, round, and reactive to light. Right eye exhibits no discharge. Left eye exhibits no discharge.  Neck: Neck supple.  Cardiovascular: Normal rate, regular rhythm, normal heart sounds and intact distal pulses.  Exam reveals no gallop and no friction rub.   No murmur heard. Pulmonary/Chest: Effort normal and breath sounds normal. No respiratory distress. She has no wheezes. She has no rales.  Lungs are clear to auscultation bilaterally. Symmetric chest expansion bilaterally.  Abdominal: Soft. Bowel sounds are normal. She exhibits no distension. There is tenderness. There is guarding.  Patient's abdomen is soft. Bowel sounds are present. Patient has mild generalized abdominal tenderness to palpation.  Musculoskeletal: She exhibits no edema.  No lower extremity edema.  Lymphadenopathy:    She has no cervical adenopathy.  Neurological: She is alert.  Patient will not raise or move her left arm or left leg. Patient is able to raise her right arm and right leg with command. She is sleepy. She will arouse to light stimulation. She has right-sided facial droop. Full neurological exam is difficult to obtain due to the patient's dementia and sleepiness. She does not follow all commands. Her speech is garbled.  Skin: Skin is warm and dry. No rash noted. She is not diaphoretic. No erythema. No pallor.  Psychiatric: She has a normal mood and affect. Her behavior is normal.  Nursing note and vitals reviewed.   ED Course  Procedures (including critical care time) Labs Review Labs Reviewed  URINALYSIS, ROUTINE W REFLEX MICROSCOPIC (NOT AT [facility]) - Abnormal; Notable  for the following:    Color, Urine AMBER (*)    APPearance CLOUDY (*)    Glucose, UA 250 (*)    Hgb urine dipstick MODERATE (*)    Bilirubin Urine SMALL (*)    Protein, ur 30 (*)    Nitrite POSITIVE (*)    Leukocytes, UA SMALL (*)    All other components within normal limits  COMPREHENSIVE METABOLIC PANEL - Abnormal; Notable for the following:    Sodium 154 (*)    Chloride 116 (*)    Glucose, Bld 203 (*)    Albumin 2.4 (*)    AST 13 (*)    GFR calc non Af Amer 50 (*)    GFR calc Af Amer 58 (*)    All other components within normal limits  CBC WITH DIFFERENTIAL/PLATELET - Abnormal; Notable for the following:    Hemoglobin 11.1 (*)    HCT 35.7 (*)    All other components within  normal limits  URINE MICROSCOPIC-ADD ON - Abnormal; Notable for the following:    Squamous Epithelial / LPF 0-5 (*)    Bacteria, UA MANY (*)    All other components within normal limits  URINE CULTURE  CBG MONITORING, ED    Imaging Review Dg Chest 1 View  12/23/2015  CLINICAL DATA:  Disorientation and lethargy EXAM: CHEST 1 VIEW COMPARISON:  December 13, 2015 FINDINGS: There is no edema or consolidation. Heart is upper normal in size with pulmonary vascularity within normal limits. No adenopathy. No bone lesions. There is atherosclerotic calcification in the aorta. IMPRESSION: No edema or consolidation. Electronically Signed   By: Bretta Bang III M.D.   On: 12/23/2015 15:25   Ct Head Wo Contrast  12/23/2015  CLINICAL DATA:  Right facial droop. Lethargy. Weakness since 3 days ago. EXAM: CT HEAD WITHOUT CONTRAST TECHNIQUE: Contiguous axial images were obtained from the base of the skull through the vertex without intravenous contrast. COMPARISON:  Brain MR dated 12/11/2015 and head CT dated 12/10/2015. FINDINGS: Diffusely enlarged ventricles and subarachnoid spaces. Patchy white matter low density in both cerebral hemispheres. Old left alignment lacunar infarct. Small pontine lacunar infarcts. No  intracranial hemorrhage, mass lesion or CT evidence of acute infarction. Unremarkable bones. Mild right sphenoid and right maxillary sinus mucosal thickening and small right maxillary sinus retention cyst. IMPRESSION: 1. No acute abnormality. 2. Atrophy, chronic small vessel white matter ischemic changes and old lacunar infarcts. 3. Mild chronic sinusitis. Electronically Signed   By: Beckie Salts M.D.   On: 12/23/2015 17:11   Ct Abdomen Pelvis W Contrast  12/23/2015  CLINICAL DATA:  Generalized abdominal pain and distention. Altered mental status. EXAM: CT ABDOMEN AND PELVIS WITH CONTRAST TECHNIQUE: Multidetector CT imaging of the abdomen and pelvis was performed using the standard protocol following bolus administration of intravenous contrast. CONTRAST:  80mL OMNIPAQUE IOHEXOL 300 MG/ML  SOLN COMPARISON:  11/26/2013 FINDINGS: Lower chest:  No acute findings. Hepatobiliary: Several tiny hepatic cysts are again noted, however no liver masses are identified. Prior cholecystectomy noted. No evidence of biliary dilatation. Pancreas: No mass, inflammatory changes, or other significant abnormality. Spleen: Within normal limits in size and appearance. Adrenals/Urinary Tract: Adrenal glands are unremarkable. Small bilateral renal cysts are again noted. No evidence of renal masses or hydronephrosis. No evidence of ureteral calculi or dilatation. Stomach/Bowel: Small hiatal hernia again noted. No evidence of obstruction, inflammatory process, or abnormal fluid collections. Vascular/Lymphatic: No pathologically enlarged lymph nodes. No evidence of abdominal aortic aneurysm. Aortic atherosclerotic plaque noted. Reproductive: Prior hysterectomy noted. Adnexal regions are unremarkable in appearance. Other: None. Musculoskeletal:  No suspicious bone lesions identified. IMPRESSION: No acute findings identified within the abdomen or pelvis. Small hiatal hernia. Electronically Signed   By: Myles Rosenthal M.D.   On: 12/23/2015 17:21    I have personally reviewed and evaluated these images and lab results as part of my medical decision-making.   EKG Interpretation   Date/Time:  Wednesday December 23 2015 14:11:47 EST Ventricular Rate:  69 PR Interval:  164 QRS Duration: 81 QT Interval:  396 QTC Calculation: 424 R Axis:   9 Text Interpretation:  Sinus rhythm No significant change since last  tracing Confirmed by [facility] MD, ERIN ([id number]) on 12/23/2015 5:29:10 PM      Filed Vitals:   12/23/15 1730 12/23/15 1745 12/23/15 1800 12/23/15 1815  BP: 131/72 133/83 140/66 136/67  Pulse: 60 69 59 56  Temp:      TempSrc:  Resp: 16 24 17 17   SpO2: 97% 97% 97% 97%     MDM   Meds given in ED:  Medications  sodium chloride 0.9 % bolus 500 mL (0 mLs Intravenous Stopped 12/23/15 1606)  cefTRIAXone (ROCEPHIN) 1 g in dextrose 5 % 50 mL IVPB (0 g Intravenous Stopped 12/23/15 1636)  sodium chloride 0.9 % bolus 500 mL (0 mLs Intravenous Stopped 12/23/15 1818)  iohexol (OMNIPAQUE) 300 MG/ML solution 80 mL (80 mLs Intravenous Contrast Given 12/23/15 1710)  LORazepam (ATIVAN) injection 1 mg (1 mg Intravenous Given 12/23/15 1703)    New Prescriptions   No medications on file    Final diagnoses:  Altered mental status  UTI (lower urinary tract infection)  Hypernatremia   This is a 82 y.o. female who presents to the ED via EMS from SNF with family members who report the patient has been declining for the past several days. The patient was diagnosed with a right pontine stroke 12/10/15 and received TPA. They report she had improvement and was doing better and was sent to skilled nursing facility. Therefore after arrival to the skilled nursing facility 6 days ago she has had slow decline. They report increased left-sided weakness over the past 4 days. Report increased right-sided facial droop for 4 days. They report she has been more fatigued and not very alert. These were the same symptoms she was having with her stroke several  days ago.   I spoke with the patient's family at bedside. They would not like the patient to undergo CPR or intubation. They would like the patient to be kept comfortable and for treatable medical conditions to be treated such as urinary tract infections and pneumonia.  On exam the patient is afebrile nontoxic appearing. She is sleepy but will arouse with light stimulation. She appears a right-sided facial droop and will not use her left arm or leg. She has abdominal tenderness to palpation with some guarding. No focal tenderness. Lungs clear to auscultation bilaterally. Urinalysis shows nitrite positive urine with small leukocytes and many bacteria. Urine sent for culture. Patient started with a gram of Rocephin for urinary tract infection. CMP reveals hypernatremia with a sodium of 154. Creatinine is 0.96. CBC shows no leukocytosis. Hemoglobin is stable. CT abdomen and pelvis shows no acute findings. CT head shows no acute abnormality. Chest x-ray shows no edema or consolidation.  Will admit for UTI and AMS.   Family is in agreement with admission.   Patient was admitted by Dr. Dalene Seltzer.   This patient was discussed with and evaluated by Dr. Dalene Seltzer who agrees with assessment and plan.   Everlene Farrier, PA-C 12/23/15 1913  Alvira Monday, MD 12/24/15 1249

## 2015-12-23 NOTE — ED Notes (Signed)
Pt arrives from [facility] via [place]. Family reports to [facility] pt was seeming lethargic and disoriented on Friday and they noticed yesterday facial droop and weakness. Family called EMS today, it is unknown why there was a delay. Pt was released from [facility] about 2 weeks ago after having a stroke per family and is at [facility] for rehab.

## 2015-12-24 ENCOUNTER — Inpatient Hospital Stay (HOSPITAL_COMMUNITY): Payer: Medicare Other

## 2015-12-24 ENCOUNTER — Ambulatory Visit (HOSPITAL_COMMUNITY): Payer: Medicare Other

## 2015-12-24 DIAGNOSIS — E119 Type 2 diabetes mellitus without complications: Secondary | ICD-10-CM | POA: Diagnosis present

## 2015-12-24 DIAGNOSIS — N390 Urinary tract infection, site not specified: Secondary | ICD-10-CM | POA: Diagnosis present

## 2015-12-24 DIAGNOSIS — R609 Edema, unspecified: Secondary | ICD-10-CM | POA: Diagnosis not present

## 2015-12-24 DIAGNOSIS — E878 Other disorders of electrolyte and fluid balance, not elsewhere classified: Secondary | ICD-10-CM | POA: Diagnosis present

## 2015-12-24 DIAGNOSIS — E7800 Pure hypercholesterolemia, unspecified: Secondary | ICD-10-CM | POA: Diagnosis not present

## 2015-12-24 DIAGNOSIS — E118 Type 2 diabetes mellitus with unspecified complications: Secondary | ICD-10-CM | POA: Diagnosis not present

## 2015-12-24 DIAGNOSIS — G309 Alzheimer's disease, unspecified: Secondary | ICD-10-CM | POA: Diagnosis present

## 2015-12-24 DIAGNOSIS — I10 Essential (primary) hypertension: Secondary | ICD-10-CM | POA: Diagnosis not present

## 2015-12-24 DIAGNOSIS — E860 Dehydration: Secondary | ICD-10-CM | POA: Diagnosis present

## 2015-12-24 DIAGNOSIS — I5032 Chronic diastolic (congestive) heart failure: Secondary | ICD-10-CM | POA: Diagnosis present

## 2015-12-24 DIAGNOSIS — E870 Hyperosmolality and hypernatremia: Secondary | ICD-10-CM | POA: Diagnosis present

## 2015-12-24 DIAGNOSIS — L8990 Pressure ulcer of unspecified site, unspecified stage: Secondary | ICD-10-CM | POA: Diagnosis present

## 2015-12-24 DIAGNOSIS — G301 Alzheimer's disease with late onset: Secondary | ICD-10-CM | POA: Diagnosis not present

## 2015-12-24 DIAGNOSIS — Z8673 Personal history of transient ischemic attack (TIA), and cerebral infarction without residual deficits: Secondary | ICD-10-CM | POA: Diagnosis not present

## 2015-12-24 DIAGNOSIS — I82619 Acute embolism and thrombosis of superficial veins of unspecified upper extremity: Secondary | ICD-10-CM | POA: Diagnosis present

## 2015-12-24 DIAGNOSIS — R600 Localized edema: Secondary | ICD-10-CM | POA: Diagnosis present

## 2015-12-24 DIAGNOSIS — B9789 Other viral agents as the cause of diseases classified elsewhere: Secondary | ICD-10-CM | POA: Diagnosis present

## 2015-12-24 DIAGNOSIS — Z8249 Family history of ischemic heart disease and other diseases of the circulatory system: Secondary | ICD-10-CM | POA: Diagnosis not present

## 2015-12-24 DIAGNOSIS — R29810 Facial weakness: Secondary | ICD-10-CM | POA: Diagnosis present

## 2015-12-24 DIAGNOSIS — I82B19 Acute embolism and thrombosis of unspecified subclavian vein: Secondary | ICD-10-CM | POA: Diagnosis present

## 2015-12-24 DIAGNOSIS — G9340 Encephalopathy, unspecified: Secondary | ICD-10-CM | POA: Diagnosis not present

## 2015-12-24 DIAGNOSIS — F0280 Dementia in other diseases classified elsewhere without behavioral disturbance: Secondary | ICD-10-CM | POA: Diagnosis present

## 2015-12-24 LAB — CBC
HCT: 36.6 % (ref 36.0–46.0)
Hemoglobin: 11.3 g/dL — ABNORMAL LOW (ref 12.0–15.0)
MCH: 28.5 pg (ref 26.0–34.0)
MCHC: 30.9 g/dL (ref 30.0–36.0)
MCV: 92.2 fL (ref 78.0–100.0)
PLATELETS: 288 10*3/uL (ref 150–400)
RBC: 3.97 MIL/uL (ref 3.87–5.11)
RDW: 12.8 % (ref 11.5–15.5)
WBC: 7.8 10*3/uL (ref 4.0–10.5)

## 2015-12-24 LAB — BASIC METABOLIC PANEL
ANION GAP: 9 (ref 5–15)
BUN: 14 mg/dL (ref 6–20)
CALCIUM: 9.3 mg/dL (ref 8.9–10.3)
CO2: 27 mmol/L (ref 22–32)
Chloride: 115 mmol/L — ABNORMAL HIGH (ref 101–111)
Creatinine, Ser: 0.78 mg/dL (ref 0.44–1.00)
GFR calc Af Amer: >60 mL/min (ref >60)
Glucose, Bld: 185 mg/dL — ABNORMAL HIGH (ref 65–99)
POTASSIUM: 3.4 mmol/L — AB (ref 3.5–5.1)
SODIUM: 151 mmol/L — AB (ref 135–145)

## 2015-12-24 LAB — GLUCOSE, CAPILLARY
GLUCOSE-CAPILLARY: 161 mg/dL — AB (ref 65–99)
Glucose-Capillary: 155 mg/dL — ABNORMAL HIGH (ref 65–99)
Glucose-Capillary: 148 mg/dL — ABNORMAL HIGH (ref 65–99)

## 2015-12-24 LAB — MRSA PCR SCREENING: MRSA BY PCR: NEGATIVE

## 2015-12-24 MED ORDER — GADOBENATE DIMEGLUMINE 529 MG/ML IV SOLN
10.0000 mL | Freq: Once | INTRAVENOUS | Status: AC | PRN
  Administered 2015-12-24: 10 mL via INTRAVENOUS

## 2015-12-24 NOTE — Evaluation (Signed)
Clinical/Bedside Swallow Evaluation Patient Details  Name: Gloria Velasquez MRN: [id number] Date of Birth: 17-Nov-1921  Today's Date: 12/24/2015 Time: SLP Start Time (ACUTE ONLY): 0845 SLP Stop Time (ACUTE ONLY): 0910 SLP Time Calculation (min) (ACUTE ONLY): 25 min  Past Medical History:  Past Medical History  Diagnosis Date  . Microalbuminuria   . Pyuria   . Dermatitis   . Dementia   . Diabetes mellitus without complication (HCC)   . Hyperlipidemia   . Hypertension    Past Surgical History: History reviewed. No pertinent past surgical history. HPI:  With recent history of stroke on 12/10/2015, received TPA, diabetes mellitus, hypertension that presented to the emergency department from [facility] healthcare for altered mental status. Per family, over the last several days patient has had generalized weakness, poor appetite, and has not been very alert. In the emergency department, CT of the head was negative acute abnormalities. Chest x-ray was also unremarkable. Patient did have positive UA.    Assessment / Plan / Recommendation Clinical Impression   Pt exhibited overt s/s of aspiration including multiple swallows, immediate throat clearing with thin via cup/straw d/t suspected delay in the initiation of the swallow, oral holding, decreased awareness of bolus and decreased laryngeal closure suspected; nectar-thickened liquids eliminated this occurrence with straw sips WFL; pt's positioning was compromised during BSE and overall cognitive functioning decreased as she was unable to follow simple commands (i.e.: "open your mouth") during BSE.  Recommend Dysphagia 1 diet with nectar-thickened liquids at this time with possibility of upgrade prior to D/C d/t previous swallowing capability; ST f/u 1-2x for diet tolerance/aspiration precautions/pt/family education.    Aspiration Risk  Mild aspiration risk    Diet Recommendation   Dysphagia 1(pureed)/nectar-thickened liquids  Medication  Administration: Crushed with puree    Other  Recommendations Oral Care Recommendations: Oral care BID   Follow up Recommendations  Other (comment) (prn)    Frequency and Duration min 2x/week  1 week       Prognosis Prognosis for Safe Diet Advancement: Good Barriers to Reach Goals: Cognitive deficits      Swallow Study   General Date of Onset: 12/23/15 HPI: With recent history of stroke on 12/10/2015, received TPA, diabetes mellitus, hypertension that presented to the emergency department from [facility] healthcare for altered mental status. Per family, over the last several days patient has had generalized weakness, poor appetite, and has not been very alert. In the emergency department, CT of the head was negative acute abnormalities. Chest x-ray was also unremarkable. Patient did have positive UA.  Type of Study: Bedside Swallow Evaluation Previous Swallow Assessment:  (12/10/15; mild impairment; rec'd D3/thin) Diet Prior to this Study: NPO Temperature Spikes Noted: No Respiratory Status: Room air History of Recent Intubation: No Behavior/Cognition: Alert;Cooperative;Confused Oral Cavity Assessment: Other (comment) (limited assessment d/t cognition/unable to follow directions) Oral Care Completed by SLP: No Oral Cavity - Dentition: Adequate natural dentition Vision: Functional for self-feeding Self-Feeding Abilities: Needs assist;Needs set up Patient Positioning: Upright in bed Baseline Vocal Quality: Low vocal intensity Volitional Cough: Cognitively unable to elicit Volitional Swallow: Unable to elicit    Oral/Motor/Sensory Function Overall Oral Motor/Sensory Function: Other (comment) (mild impairment with difficulty following commands) Facial Symmetry: Abnormal symmetry left;Suspected CN VII (facial) dysfunction   Ice Chips Ice chips: Impaired Presentation: Spoon Oral Phase Functional Implications: Oral holding   Thin Liquid Thin Liquid: Impaired Presentation:  Cup;Spoon;Straw Oral Phase Impairments: Poor awareness of bolus Oral Phase Functional Implications: Oral holding Pharyngeal  Phase Impairments: Suspected delayed  Swallow;Multiple swallows;Throat Clearing - Immediate    Nectar Thick Nectar Thick Liquid: Within functional limits Presentation: Straw   Honey Thick Honey Thick Liquid: Not tested   Puree Puree: Impaired Presentation: Spoon Oral Phase Functional Implications: Prolonged oral transit;Oral holding   Solid      Solid: Impaired Presentation: Spoon Oral Phase Impairments: Poor awareness of bolus;Impaired mastication Oral Phase Functional Implications: Prolonged oral transit;Impaired mastication;Oral holding Pharyngeal Phase Impairments: Suspected delayed Swallow    Functional Assessment Tool Used:  (NOMS) Functional Limitations: Swallowing Swallow Current Status ([id number]): At least 40 percent but less than 60 percent impaired, limited or restricted Swallow Goal Status [phone]): At least 20 percent but less than 40 percent impaired, limited or restricted   Shala Baumbach,PAT, M.S., CCC-SLP 12/24/2015,10:05 AM

## 2015-12-24 NOTE — Progress Notes (Signed)
Preliminary results by tech - Right Upper Ext. Venous Duplex Completed. Positive for subacute deep vein thrombosis involving the subclavian, axillary and brachial veins and superficial vein thrombosis in the basilic vein. Results given to patient's nurse.  Marilynne Halsted, BS, RDMS, RVT

## 2015-12-24 NOTE — Progress Notes (Signed)
PROGRESS NOTE  Gloria Velasquez ZOX:[id number] DOB: 1922/10/08 DOA: 12/23/2015 PCP: No primary care provider on file. Outpatient Specialists:      Brief Narrative: With recent history of stroke on 12/10/2015, received TPA, diabetes mellitus, hypertension that presented to the emergency department from [facility] healthcare for altered mental status.  Assessment & Plan: Active Problems:   Diabetes type 2, controlled (HCC)   HYPERCHOLESTEROLEMIA   HYPERTENSION, BENIGN SYSTEMIC   Alzheimer's disease   UTI (lower urinary tract infection)   Acute encephalopathy   Pressure ulcer   Acute encephalopathy - Likely secondary to urinary tract infection and decreased oral intake - CT head: No acute abnormality - CT abdomen and pelvis: No acute findings identified in the abdomen or pelvis, small hiatal hernia - Chest x-ray: No edema or consolidation - obtain brain MRI given recent CVA  Urinary tract infection - UA showed WBC 6-30, many bacteria, small leukocytes, positive nitrites - Will place patient on ceftriaxone - Urine culture pending  Hypernatremia/hyperchloremia - Secondary to dehydration and decreased oral intake - Will place patient on gentle IV fluids and continue to monitor BMP  Left upper extremity edema with acute DVT - discussed with pharmacy, high risk with Lovenox given recent tPA - [place] positive for subacute deep vein thrombosis involving the subclavian, axillary and brachial veins and superficial vein thrombosis in the basilic vein - Eliquis as soon as brain MRI complete to r/o new CVA or subtle hemorrhagic transformations in the past 2 weeks  Chronic diastolic heart failure - Echocardiogram 12/11/2015 showed EF of 65-70%, grade 2 diastolic dysfunction - Continue to monitor intake and output, daily weight - Patient appears to be clinically dry  Recent CVA - Patient recently admitted 2 weeks ago for CVA and was discharged to [facility] healthcare - Continue aspirin,  statin  Dementia - Continue Aricept  Diabetes mellitus, type II - Metformin held - Place patient on insulin sliding scale CBG monitoring - Hemoglobin A1c 7.1 on 12/11/2015  Essential hypertension - Continue amlodipine, lisinopril   DVT prophylaxis: Lovenox Code Status: Full Family Communication: could not reach family by phone Disposition Plan: SNF when ready  Barriers for discharge: acute encephalopathy   Consultants:   None   Procedures:   None   Antimicrobials:  Ceftriaxone 2/22 >>   Subjective: - alert, confused, cannot contribute much to story   Objective: Filed Vitals:   12/23/15 1800 12/23/15 1815 12/23/15 2015 12/24/15 0641  BP: 140/66 136/67 163/75 167/68  Pulse: 59 56 59 57  Temp:   98 F (36.7 C) 97.7 F (36.5 C)  TempSrc:   Oral Oral  Resp: Weight:   56.1 kg (123 lb 10.9 oz) 55.792 kg (123 lb)  SpO2: 97% 97% 100% 100%   No intake or output data in the 24 hours ending 12/24/15 1320 Filed Weights   12/23/15 2015 12/24/15 0641  Weight: 56.1 kg (123 lb 10.9 oz) 55.792 kg (123 lb)   Examination: BP 167/68 mmHg  Pulse 57  Temp(Src) 97.7 F (36.5 C) (Oral)  Resp 18  Wt 55.792 kg (123 lb)  SpO2 100% General exam: NAD, confused Respiratory system: Clear. No increased work of breathing. No wheezing, no crackles Cardiovascular system: regular rate and rhythm, no murmurs, gallops. No JVD. No peripheral edema.  Gastrointestinal system: Abdomen is nondistended, soft and nontender. Normal bowel sounds heard. Central nervous system: AxOx1. Left sided weakness, does not follow commands consistently Extremities: No clubbing/cyanosis Skin: no rashes   Data Reviewed:  I have personally reviewed following labs and imaging studies  CBC:  Recent Labs Lab 12/23/15 1445 12/24/15 0603  WBC 7.4 7.8  NEUTROABS 5.7  --   HGB 11.1* 11.3*  HCT 35.7* 36.6  MCV 92.0 92.2  PLT 299 288   Basic Metabolic Panel:  Recent Labs Lab 12/23/15 1445  12/24/15 0603  NA 154* 151*  K 3.5 3.4*  CL 116* 115*  CO2 26 27  GLUCOSE 203* 185*  BUN 18 14  CREATININE 0.96 0.78  CALCIUM 9.7 9.3   GFR: Estimated Creatinine Clearance: 35.2 mL/min (by C-G formula based on Cr of 0.78). Liver Function Tests:  Recent Labs Lab 12/23/15 1445  AST 13*  ALT 15  ALKPHOS 63  BILITOT 0.4  PROT 6.5  ALBUMIN 2.4*   No results for input(s): LIPASE, AMYLASE in the last 168 hours. No results for input(s): AMMONIA in the last 168 hours. Coagulation Profile: No results for input(s): INR, PROTIME in the last 168 hours. Cardiac Enzymes: No results for input(s): CKTOTAL, CKMB, CKMBINDEX, TROPONINI in the last 168 hours. BNP (last 3 results) No results for input(s): PROBNP in the last 8760 hours. HbA1C: No results for input(s): HGBA1C in the last 72 hours. CBG:  Recent Labs Lab 12/23/15 2116 12/24/15 0811 12/24/15 1219  GLUCAP 155* 155* 148*   Lipid Profile: No results for input(s): CHOL, HDL, LDLCALC, TRIG, CHOLHDL, LDLDIRECT in the last 72 hours. Thyroid Function Tests: No results for input(s): TSH, T4TOTAL, FREET4, T3FREE, THYROIDAB in the last 72 hours. Anemia Panel: No results for input(s): VITAMINB12, FOLATE, FERRITIN, TIBC, IRON, RETICCTPCT in the last 72 hours. Urine analysis:    Component Value Date/Time   COLORURINE AMBER* 12/23/2015 1500   APPEARANCEUR CLOUDY* 12/23/2015 1500   LABSPEC 1.029 12/23/2015 1500   PHURINE 5.5 12/23/2015 1500   GLUCOSEU 250* 12/23/2015 1500   HGBUR MODERATE* 12/23/2015 1500   BILIRUBINUR SMALL* 12/23/2015 1500   KETONESUR NEGATIVE 12/23/2015 1500   PROTEINUR 30* 12/23/2015 1500   UROBILINOGEN 0.2 09/26/2014 1607   NITRITE POSITIVE* 12/23/2015 1500   LEUKOCYTESUR SMALL* 12/23/2015 1500   Sepsis Labs: Invalid input(s): PROCALCITONIN, LACTICIDVEN  Recent Results (from the past 240 hour(s))  Urine culture     Status: None (Preliminary result)   Collection Time: 12/23/15  3:00 PM  Result Value  Ref Range Status   Specimen Description URINE, CATHETERIZED  Final   Special Requests NONE  Final   Culture >=100,000 COLONIES/mL GRAM NEGATIVE RODS  Final   Report Status PENDING  Incomplete  MRSA PCR Screening     Status: None   Collection Time: 12/23/15  8:05 PM  Result Value Ref Range Status   MRSA by PCR NEGATIVE NEGATIVE Final    Comment:        The GeneXpert MRSA Assay (FDA approved for NASAL specimens only), is one component of a comprehensive MRSA colonization surveillance program. It is not intended to diagnose MRSA infection nor to guide or monitor treatment for MRSA infections.     Radiology Studies: Dg Chest 1 View  12/23/2015  CLINICAL DATA:  Disorientation and lethargy EXAM: CHEST 1 VIEW COMPARISON:  December 13, 2015 FINDINGS: There is no edema or consolidation. Heart is upper normal in size with pulmonary vascularity within normal limits. No adenopathy. No bone lesions. There is atherosclerotic calcification in the aorta. IMPRESSION: No edema or consolidation. Electronically Signed   By: Bretta Bang III M.D.   On: 12/23/2015 15:25   Ct Head Wo Contrast  12/23/2015  CLINICAL DATA:  Right facial droop. Lethargy. Weakness since 3 days ago. EXAM: CT HEAD WITHOUT CONTRAST TECHNIQUE: Contiguous axial images were obtained from the base of the skull through the vertex without intravenous contrast. COMPARISON:  Brain MR dated 12/11/2015 and head CT dated 12/10/2015. FINDINGS: Diffusely enlarged ventricles and subarachnoid spaces. Patchy white matter low density in both cerebral hemispheres. Old left alignment lacunar infarct. Small pontine lacunar infarcts. No intracranial hemorrhage, mass lesion or CT evidence of acute infarction. Unremarkable bones. Mild right sphenoid and right maxillary sinus mucosal thickening and small right maxillary sinus retention cyst. IMPRESSION: 1. No acute abnormality. 2. Atrophy, chronic small vessel white matter ischemic changes and old lacunar  infarcts. 3. Mild chronic sinusitis. Electronically Signed   By: Beckie Salts M.D.   On: 12/23/2015 17:11   Ct Abdomen Pelvis W Contrast  12/23/2015  CLINICAL DATA:  Generalized abdominal pain and distention. Altered mental status. EXAM: CT ABDOMEN AND PELVIS WITH CONTRAST TECHNIQUE: Multidetector CT imaging of the abdomen and pelvis was performed using the standard protocol following bolus administration of intravenous contrast. CONTRAST:  80mL OMNIPAQUE IOHEXOL 300 MG/ML  SOLN COMPARISON:  11/26/2013 FINDINGS: Lower chest:  No acute findings. Hepatobiliary: Several tiny hepatic cysts are again noted, however no liver masses are identified. Prior cholecystectomy noted. No evidence of biliary dilatation. Pancreas: No mass, inflammatory changes, or other significant abnormality. Spleen: Within normal limits in size and appearance. Adrenals/Urinary Tract: Adrenal glands are unremarkable. Small bilateral renal cysts are again noted. No evidence of renal masses or hydronephrosis. No evidence of ureteral calculi or dilatation. Stomach/Bowel: Small hiatal hernia again noted. No evidence of obstruction, inflammatory process, or abnormal fluid collections. Vascular/Lymphatic: No pathologically enlarged lymph nodes. No evidence of abdominal aortic aneurysm. Aortic atherosclerotic plaque noted. Reproductive: Prior hysterectomy noted. Adnexal regions are unremarkable in appearance. Other: None. Musculoskeletal:  No suspicious bone lesions identified. IMPRESSION: No acute findings identified within the abdomen or pelvis. Small hiatal hernia. Electronically Signed   By: Myles Rosenthal M.D.   On: 12/23/2015 17:21   Scheduled Meds: . amLODipine  10 mg Oral Daily  . aspirin EC  325 mg Oral Daily  . atorvastatin  10 mg Oral q1800  . cefTRIAXone (ROCEPHIN)  IV  1 g Intravenous Q24H  . cholecalciferol  1,000 Units Oral Daily  . donepezil  10 mg Oral QHS  . enoxaparin (LOVENOX) injection  30 mg Subcutaneous Q24H  . feeding  supplement (ENSURE ENLIVE)  237 mL Oral BID BM  . ferrous sulfate  325 mg Oral Q breakfast  . insulin aspart  0-9 Units Subcutaneous TID WC  . lisinopril  20 mg Oral Daily  . polyethylene glycol  17 g Oral Daily  . senna  1 tablet Oral Daily   Continuous Infusions: . sodium chloride 75 mL/hr at 12/23/15 2100   Pamella Pert, MD, PhD [facility] Pager [phone]  If 7PM-7AM, please contact night-coverage [email or web address] Password [email or web address] 12/24/2015, 1:20 PM

## 2015-12-24 NOTE — Progress Notes (Signed)
Initial Nutrition Assessment  DOCUMENTATION CODES:   Not applicable  INTERVENTION:   -D/c Ensure Enlive -Magic Cup TID   NUTRITION DIAGNOSIS:   Inadequate oral intake related to lethargy/confusion as evidenced by meal completion < 50%.  GOAL:   Patient will meet greater than or equal to 90% of their needs  MONITOR:   PO intake, Supplement acceptance, Diet advancement, Labs, Weight trends, Skin, I & O's  REASON FOR ASSESSMENT:   Low Braden    ASSESSMENT:   Ms Hagger is a 82 year old female With recent history of stroke on 12/10/2015, received TPA, diabetes mellitus, hypertension that presented to the emergency department from [facility] healthcare for altered mental status. Per family, over the last several days patient has had generalized weakness, poor appetite, and has not been very alert. In the emergency department, CT of the head was negative acute abnormalities. Chest x-ray was also unremarkable. Patient did have positive UA. Patient was given 1 dose of ceftriaxone. Sodium and chloride were both elevated. TRH called for admission.  Pt admitted with acute encephalopathy.   Pt lying in bed at time of visit. Pt was very difficult to arouse, but answered some of this RD's questions unintelligibly.   Pt underwent BSE this morning. Pt was advanced to a dysphagia 1 diet with nectar thick liquids, due to mild aspiration risk.   Per nurse tech, pt favors right side and requires feeding assistance. She ate very little for breakfast this morning and refused Ensure supplement.   Nutrition-Focused physical exam completed. Findings are mild fat depletion, no muscle depletion, and no edema.   Wt hx difficult to interpret; it appears pt's weight fluctuates at baseline.   Labs reviewed.   Diet Order:  DIET - DYS 1 Room service appropriate?: Yes; Fluid consistency:: Nectar Thick  Skin:  Wound (see comment) (stage II sacrum, granulation)  Last BM:  PTA  Height:   Ht Readings  from Last 1 Encounters:  12/12/15 5' 1.81" (1.57 m)    Weight:   Wt Readings from Last 1 Encounters:  12/24/15 123 lb (55.792 kg)    Ideal Body Weight:  50 kg  BMI:  Body mass index is 22.63 kg/(m^2).  Estimated Nutritional Needs:   Kcal:  1650-1850  Protein:  65-80 grams  Fluid:  1.6-1.8 L  EDUCATION NEEDS:   No education needs identified at this time  Tonya Wantz A. Mayford Knife, RD, LDN, CDE Pager: [phone] After hours Pager: [phone]

## 2015-12-24 NOTE — Care Management Note (Signed)
Case Management Note  Patient Details  Name: SELAH KLANG MRN: [id number] Date of Birth: [birth year]  Subjective/Objective:                 Admitted with AMS, with recent history of stroke on 12/10/2015,  diabetes mellitus, hypertension.    Action/Plan: Return to SNF when medically stable. CM to f/u with d/c needs.   Expected Discharge Date:                  Expected Discharge Plan:  Skilled Nursing Facility [facility])  In-House Referral:  Clinical Social Work  Discharge planning Services  CM Consult  Post Acute Care Choice:    Choice offered to:     DME Arranged:    DME Agency:     HH Arranged:    HH Agency:     Status of Service:  In process, will continue to follow  Medicare Important Message Given:    Date Medicare IM Given:    Medicare IM give by:    Date Additional Medicare IM Given:    Additional Medicare Important Message give by:     If discussed at Long Length of Stay Meetings, dates discussed:    Additional Comments:  Epifanio Lesches, [place] [phone] 12/24/2015, 3:28 PM

## 2015-12-24 NOTE — NC FL2 (Signed)
[place] MEDICAID FL2 LEVEL OF CARE SCREENING TOOL     IDENTIFICATION  Patient Name: Gloria Velasquez Birthdate: [birth year] Sex: female Admission Date (Current Location): 12/23/2015  [facility] and [place] Number:  Producer, television/film/video and Address:  [facility], [address], [place], [place] [address]      Provider Number: [id number]  Attending Physician Name and Address:  Leatha Gilding, MD  Relative Name and Phone Number:  Dorothy Puffer, nephew, [phone]    Current Level of Care: Hospital Recommended Level of Care: Skilled Nursing Facility Prior Approval Number:    Date Approved/Denied:   PASRR Number:    Discharge Plan: SNF    Current Diagnoses: Patient Active Problem List   Diagnosis Date Noted  . UTI (lower urinary tract infection) 12/23/2015  . Acute encephalopathy 12/23/2015  . Fever, unspecified 12/17/2015  . Lethargy 12/17/2015  . Alzheimer's disease 12/17/2015  . Right pontine stroke (HCC) s/p IV tPA 12/10/2015  . Diabetes type 2, controlled (HCC) 12/28/2006  . HYPERCHOLESTEROLEMIA 12/28/2006  . HYPERTENSION, BENIGN SYSTEMIC 12/28/2006  . ARTHRITIS 12/28/2006    Orientation RESPIRATION BLADDER Height & Weight      (Not responding to commands)  Normal Incontinent Weight: 123 lb (55.792 kg) Height:     BEHAVIORAL SYMPTOMS/MOOD NEUROLOGICAL BOWEL NUTRITION STATUS   (N/A)   Continent  (Please see DC summary)  AMBULATORY STATUS COMMUNICATION OF NEEDS Skin   Extensive Assist Verbally PU Stage and Appropriate Care (Stage II on sacrum)                       Personal Care Assistance Level of Assistance  Bathing, Feeding, Dressing Bathing Assistance: Maximum assistance Feeding assistance: Limited assistance Dressing Assistance: Maximum assistance     Functional Limitations Info             SPECIAL CARE FACTORS FREQUENCY                       Contractures      Additional Factors Info  Code Status,  Allergies, Insulin Sliding Scale Code Status Info: Full Allergies Info: NKA   Insulin Sliding Scale Info: insulin aspart (novoLOG) injection 0-9 Units       Current Medications (12/24/2015):  This is the current hospital active medication list Current Facility-Administered Medications  Medication Dose Route Frequency Provider Last Rate Last Dose  . 0.9 %  sodium chloride infusion   Intravenous Continuous Maryann Mikhail, DO 75 mL/hr at 12/23/15 2100    . acetaminophen (TYLENOL) tablet 650 mg  650 mg Oral Q6H PRN Maryann Mikhail, DO       Or  . acetaminophen (TYLENOL) suppository 650 mg  650 mg Rectal Q6H PRN Maryann Mikhail, DO      . acetaminophen (TYLENOL) tablet 650 mg  650 mg Oral Q6H PRN Maryann Mikhail, DO      . amLODipine (NORVASC) tablet 10 mg  10 mg Oral Daily Maryann Mikhail, DO   10 mg at 12/24/15 5621  . aspirin EC tablet 325 mg  325 mg Oral Daily Maryann Mikhail, DO   325 mg at 12/24/15 3086  . atorvastatin (LIPITOR) tablet 10 mg  10 mg Oral q1800 Maryann Mikhail, DO   10 mg at 12/23/15 2143  . cefTRIAXone (ROCEPHIN) 1 g in dextrose 5 % 50 mL IVPB  1 g Intravenous Q24H [employer], RPH      . cholecalciferol (VITAMIN D) tablet 1,000  Units  1,000 Units Oral Daily Maryann Mikhail, DO   1,000 Units at 12/24/15 [phone]  . donepezil (ARICEPT) tablet 10 mg  10 mg Oral QHS Maryann Mikhail, DO   10 mg at 12/23/15 2143  . enoxaparin (LOVENOX) injection 30 mg  30 mg Subcutaneous Q24H Maryann Mikhail, DO   30 mg at 12/23/15 2142  . feeding supplement (ENSURE ENLIVE) (ENSURE ENLIVE) liquid 237 mL  237 mL Oral BID BM Maryann Mikhail, DO      . ferrous sulfate tablet 325 mg  325 mg Oral Q breakfast Maryann Mikhail, DO   325 mg at 12/24/15 5621  . insulin aspart (novoLOG) injection 0-9 Units  0-9 Units Subcutaneous TID WC Maryann Mikhail, DO   2 Units at 12/24/15 [phone]  . lisinopril (PRINIVIL,ZESTRIL) tablet 20 mg  20 mg Oral Daily Maryann Mikhail, DO   20 mg at 12/24/15 5784  . ondansetron  (ZOFRAN) tablet 4 mg  4 mg Oral Q6H PRN Maryann Mikhail, DO       Or  . ondansetron (ZOFRAN) injection 4 mg  4 mg Intravenous Q6H PRN Maryann Mikhail, DO      . polyethylene glycol (MIRALAX / GLYCOLAX) packet 17 g  17 g Oral Daily Maryann Mikhail, DO   17 g at 12/24/15 6962  . senna (SENOKOT) tablet 8.6 mg  1 tablet Oral Daily Maryann Mikhail, DO   8.6 mg at 12/24/15 9528     Discharge Medications: Please see discharge summary for a list of discharge medications.  Relevant Imaging Results:  Relevant Lab Results:   Additional Information SS#: [id number]  Mearl Latin, LCSWA

## 2015-12-25 DIAGNOSIS — I824Y9 Acute embolism and thrombosis of unspecified deep veins of unspecified proximal lower extremity: Secondary | ICD-10-CM

## 2015-12-25 LAB — BASIC METABOLIC PANEL
Anion gap: 8 (ref 5–15)
BUN: 11 mg/dL (ref 6–20)
CALCIUM: 9.1 mg/dL (ref 8.9–10.3)
CO2: 26 mmol/L (ref 22–32)
CREATININE: 0.79 mg/dL (ref 0.44–1.00)
Chloride: 114 mmol/L — ABNORMAL HIGH (ref 101–111)
GFR calc non Af Amer: >60 mL/min (ref >60)
GLUCOSE: 166 mg/dL — AB (ref 65–99)
Potassium: 3.1 mmol/L — ABNORMAL LOW (ref 3.5–5.1)
Sodium: 148 mmol/L — ABNORMAL HIGH (ref 135–145)

## 2015-12-25 LAB — URINE CULTURE

## 2015-12-25 LAB — GLUCOSE, CAPILLARY
Glucose-Capillary: 166 mg/dL — ABNORMAL HIGH (ref 65–99)
Glucose-Capillary: 223 mg/dL — ABNORMAL HIGH (ref 65–99)

## 2015-12-25 MED ORDER — CEPHALEXIN 500 MG PO CAPS: 500.0000 mg | ORAL_CAPSULE | Freq: Two times a day (BID) | ORAL | Status: AC

## 2015-12-25 MED ORDER — DEXTROSE 5 % IV SOLN
INTRAVENOUS | Status: DC
  Administered 2015-12-25: 08:00:00 via INTRAVENOUS
  Filled 2015-12-25: qty 1000

## 2015-12-25 MED ORDER — APIXABAN 5 MG PO TABS: 5.0000 mg | ORAL_TABLET | Freq: Two times a day (BID) | ORAL | Status: DC

## 2015-12-25 MED ORDER — ASPIRIN EC 81 MG PO TBEC
81.0000 mg | DELAYED_RELEASE_TABLET | Freq: Every day | ORAL | Status: DC
  Administered 2015-12-25: 81 mg via ORAL
  Filled 2015-12-25: qty 1

## 2015-12-25 MED ORDER — POTASSIUM CHLORIDE CRYS ER 20 MEQ PO TBCR
40.0000 meq | EXTENDED_RELEASE_TABLET | ORAL | Status: AC
  Administered 2015-12-25 (×2): 40 meq via ORAL
  Filled 2015-12-25: qty 2

## 2015-12-25 MED ORDER — ASPIRIN 81 MG PO TBEC: 81.0000 mg | DELAYED_RELEASE_TABLET | Freq: Every day | ORAL | Status: DC

## 2015-12-25 MED ORDER — APIXABAN 2.5 MG PO TABS: 2.5000 mg | ORAL_TABLET | Freq: Two times a day (BID) | ORAL | Status: DC

## 2015-12-25 MED ORDER — CEPHALEXIN 500 MG PO CAPS
500.0000 mg | ORAL_CAPSULE | Freq: Two times a day (BID) | ORAL | Status: DC
  Administered 2015-12-25: 500 mg via ORAL
  Filled 2015-12-25: qty 1

## 2015-12-25 MED ORDER — APIXABAN 5 MG PO TABS
10.0000 mg | ORAL_TABLET | Freq: Two times a day (BID) | ORAL | Status: DC
  Administered 2015-12-25: 10 mg via ORAL

## 2015-12-25 MED ORDER — APIXABAN 5 MG PO TABS: 10.0000 mg | ORAL_TABLET | Freq: Two times a day (BID) | ORAL | Status: AC

## 2015-12-25 NOTE — Care Management Note (Signed)
Case Management Note  Patient Details  Name: Gloria Velasquez MRN: [id number] Date of Birth: 28-Feb-1922  Subjective/Objective:                    Action/Plan: Plan is for pt to d/c back to [facility] today. No further needs per CM.  Expected Discharge Date:      12/25/15            Expected Discharge Plan:  Skilled Nursing Facility [facility])  In-House Referral:  Clinical Social Work  Discharge planning Services  CM Consult  Post Acute Care Choice:    Choice offered to:     DME Arranged:    DME Agency:     HH Arranged:    HH Agency:     Status of Service:  Completed, signed off  Medicare Important Message Given:    Date Medicare IM Given:    Medicare IM give by:    Date Additional Medicare IM Given:    Additional Medicare Important Message give by:     If discussed at Long Length of Stay Meetings, dates discussed:    Additional Comments:  Epifanio Lesches, [place] [phone] 12/25/2015, 10:21 AM

## 2015-12-25 NOTE — Progress Notes (Signed)
Speech Language Pathology Treatment: Dysphagia  Patient Details Name: Gloria Velasquez MRN: [id number] DOB: Mar 05, 1922 Today's Date: 12/25/2015 Time: 4098-1191 SLP Time Calculation (min) (ACUTE ONLY): 15 min  Assessment / Plan / Recommendation Clinical Impression  Pt seen sitting upright in bed with nurse tech in room.  Nurse tech reports pt with overall good tolerance of breakfast - coughing x1 only.  Pt reported to this SLP pain - pointing to her stomach stating she felt sick and also muscular pain - called secretary to request medicine per pt.    Pt was willing to accept magic cup, gingerale and saltine cracker.  She only opened her oral cavity minimally and accepted very minimal amount.  No sealage on straw noted, therefore provided liquids via tsp, cup.  Immediate post=swallow cough with delayed multiple swallows noted with thin liquids- concerning for aspiration.  No indication of aspiration or significant dysphagia with nectar thick gingerale, magic cup.  Very poor mastication efforts with cracker with boluses remaining in anterior oral cavity and on hard palate without pt awareness.  SLP removed.  Question if pt's pain is impacting her consumption?    Recommend continue diet with strict precautions - including assuring she clears oral cavity and is accepting of intake.  Concern for her ability to meet nutrition is present currently as poor intake noted of breakfast.     HPI HPI: With recent history of stroke on 12/10/2015, received TPA, diabetes mellitus, hypertension that presented to the emergency department from [facility] healthcare for altered mental status. Per family, over the last several days patient has had generalized weakness, poor appetite, and has not been very alert. In the emergency department, CT of the head was negative acute abnormalities. Chest x-ray was also unremarkable. Patient did have positive UA.       SLP Plan  Continue with current plan of care      Recommendations  Diet recommendations: Dysphagia 1 (puree);Nectar-thick liquid Liquids provided via: Cup;Straw (straw if pt can form suction) Medication Administration: Crushed with puree Supervision: Staff to assist with self feeding Compensations: Minimize environmental distractions;Slow rate;Small sips/bites;Other (Comment) (check for oral residuals) Postural Changes and/or Swallow Maneuvers: Seated upright 90 degrees;Upright 30-60 min after meal             Follow up Recommendations: Other (comment) (tbd) Plan: Continue with current plan of care     GO                Donavan Burnet, MS [facility] SLP [phone]

## 2015-12-25 NOTE — Discharge Summary (Addendum)
Physician Discharge Summary  Gloria Velasquez GNF:[id number] DOB: Aug 16, 1922 DOA: 12/23/2015  PCP: No primary care provider on file.  Admit date: 12/23/2015 Discharge date: 12/25/2015  Time spent: > 30 minutes  Recommendations for Outpatient Follow-up:  1. Follow up with SNF MD in 1 week 2. Repeat BMP in 3-4 days 3. Continue Keflex for 5 additional days for UTI 4. Patient started on Eliquis 10 mg BID for 7 days, then on 3/3 needs to be changed to 5 mg BID for DVT  Discharge Diagnoses:  Active Problems:   Diabetes type 2, controlled (HCC)   HYPERCHOLESTEROLEMIA   HYPERTENSION, BENIGN SYSTEMIC   Alzheimer's disease   UTI (lower urinary tract infection)   Acute encephalopathy   Pressure ulcer  Discharge Condition: stable  Diet recommendation: dysphagia 1  Filed Weights   12/23/15 2015 12/24/15 0641 12/25/15 0500  Weight: 56.1 kg (123 lb 10.9 oz) 55.792 kg (123 lb) 53.3 kg (117 lb 8.1 oz)    History of present illness:  See H&P, Labs, Consult and Test reports for all details in brief, patient is a 82 y.o. female With recent history of stroke on 12/10/2015, received TPA, diabetes mellitus, hypertension that presented to the emergency department from [facility] for altered mental status. Per family, over the last several days patient has had generalized weakness, poor appetite, and has not been very alert. In the emergency department, CT of the head was negative acute abnormalities. Chest x-ray was also unremarkable. Patient did have positive UA. Patient was given 1 dose of ceftriaxone. Sodium and chloride were both elevated. TRH called for admission.  Hospital Course:  Acute encephalopathy - Likely secondary to urinary tract infection and decreased oral intake. Patient was started on Ceftriaxone with significant improvement in her mental status and now back to baseline per family. Her UA speciated E coli and her antibiotics were transitioned to Keflex per sensitivities and  she is to complete 5 additional days. Repeat brain MRI without acute findings.  Hypernatremia/hyperchloremia - Secondary to dehydration and decreased oral intake, improving, patient with good po intake, expect continued improvement, please repeat BMP in 3-4 days Left upper extremity edema with acute DVT - discussed with pharmacy, high risk with Lovenox given recent tPA, started on Eliquis. [place] positive for subacute deep vein thrombosis involving the subclavian, axillary and brachial veins and superficial vein thrombosis in the basilic vein Chronic diastolic heart failure - Echocardiogram 12/11/2015 showed EF of 65-70%, grade 2 diastolic dysfunction. Euvolemic Recent CVA - Patient recently admitted 2 weeks ago for CVA and was discharged to [facility] healthcare. Now on eliquis for DVT Dementia - Continue Aricept Diabetes mellitus, type II - Hemoglobin A1c 7.1 on 12/11/2015 Essential hypertension - Continue amlodipine, lisinopril  Procedures:  None    Consultations:  None   Discharge Exam: Filed Vitals:   12/23/15 2015 12/24/15 0641 12/24/15 1355 12/25/15 0500  BP: 163/75 167/68 164/73   Pulse: 59 57 62   Temp: 98 F (36.7 C) 97.7 F (36.5 C) 98.2 F (36.8 C)   TempSrc: Oral Oral Oral   Resp: 17 18 18    Weight: 56.1 kg (123 lb 10.9 oz) 55.792 kg (123 lb)  53.3 kg (117 lb 8.1 oz)  SpO2: 100% 100% 100%     General: NAD Cardiovascular: RRR Respiratory: CTA biL  Discharge Instructions Activity:  As tolerated   Get Medicines reviewed and adjusted: Please take all your medications with you for your next visit with your Primary MD  Please request your  Primary MD to go over all hospital tests and procedure/radiological results at the follow up, please ask your Primary MD to get all Hospital records sent to his/her office.  If you experience worsening of your admission symptoms, develop shortness of breath, life threatening emergency, suicidal or homicidal thoughts you must seek  medical attention immediately by calling 911 or calling your MD immediately if symptoms less severe.  You must read complete instructions/literature along with all the possible adverse reactions/side effects for all the Medicines you take and that have been prescribed to you. Take any new Medicines after you have completely understood and accpet all the possible adverse reactions/side effects.   Do not drive when taking Pain medications.   Do not take more than prescribed Pain, Sleep and Anxiety Medications  Special Instructions: If you have smoked or chewed Tobacco in the last 2 yrs please stop smoking, stop any regular Alcohol and or any Recreational drug use.  Wear Seat belts while driving.  Please note  You were cared for by a hospitalist during your hospital stay. Once you are discharged, your primary care physician will handle any further medical issues. Please note that NO REFILLS for any discharge medications will be authorized once you are discharged, as it is imperative that you return to your primary care physician (or establish a relationship with a primary care physician if you do not have one) for your aftercare needs so that they can reassess your need for medications and monitor your lab values.    Medication List    STOP taking these medications        aspirin 325 MG EC tablet      TAKE these medications        acetaminophen 325 MG tablet  Commonly known as:  TYLENOL  Take 650 mg by mouth every 6 (six) hours as needed for mild pain or moderate pain.     amLODipine 10 MG tablet  Commonly known as:  NORVASC  Take 10 mg by mouth daily.     apixaban 5 MG Tabs tablet  Commonly known as:  ELIQUIS  Take 2 tablets (10 mg total) by mouth 2 (two) times daily. 10 gm BID for 7 days then 5 mg BID     atorvastatin 10 MG tablet  Commonly known as:  LIPITOR  Take 1 tablet (10 mg total) by mouth daily.     cephALEXin 500 MG capsule  Commonly known as:  KEFLEX  Take 1  capsule (500 mg total) by mouth every 12 (twelve) hours.     cholecalciferol 1000 units tablet  Commonly known as:  VITAMIN D  Take 1,000 Units by mouth daily.     donepezil 10 MG tablet  Commonly known as:  ARICEPT  Take 10 mg by mouth at bedtime.     feeding supplement (ENSURE ENLIVE) Liqd  Take 237 mLs by mouth 2 (two) times daily between meals.     ferrous sulfate 325 (65 FE) MG tablet  Take 325 mg by mouth daily with breakfast.     lisinopril 20 MG tablet  Commonly known as:  PRINIVIL,ZESTRIL  Take 1 tablet (20 mg total) by mouth daily.     metFORMIN 1000 MG tablet  Commonly known as:  GLUCOPHAGE  Take 1,000 mg by mouth 2 (two) times daily with a meal.     polyethylene glycol packet  Commonly known as:  MIRALAX / GLYCOLAX  Take 17 g by mouth daily.     senna 8.6  MG Tabs tablet  Commonly known as:  SENOKOT  Take 1 tablet by mouth daily.       Follow-up Information    Follow up with [facility].   Specialty:  Skilled Nursing Facility   Contact information:   [address] [place] [address] [phone]      The results of significant diagnostics from this hospitalization (including imaging, microbiology, ancillary and laboratory) are listed below for reference.    Significant Diagnostic Studies: Dg Chest 1 View  12/23/2015  CLINICAL DATA:  Disorientation and lethargy EXAM: CHEST 1 VIEW COMPARISON:  December 13, 2015 FINDINGS: There is no edema or consolidation. Heart is upper normal in size with pulmonary vascularity within normal limits. No adenopathy. No bone lesions. There is atherosclerotic calcification in the aorta. IMPRESSION: No edema or consolidation. Electronically Signed   By: Bretta Bang III M.D.   On: 12/23/2015 15:25   Ct Head Wo Contrast  12/23/2015  CLINICAL DATA:  Right facial droop. Lethargy. Weakness since 3 days ago. EXAM: CT HEAD WITHOUT CONTRAST TECHNIQUE: Contiguous axial images were obtained from the  base of the skull through the vertex without intravenous contrast. COMPARISON:  Brain MR dated 12/11/2015 and head CT dated 12/10/2015. FINDINGS: Diffusely enlarged ventricles and subarachnoid spaces. Patchy white matter low density in both cerebral hemispheres. Old left alignment lacunar infarct. Small pontine lacunar infarcts. No intracranial hemorrhage, mass lesion or CT evidence of acute infarction. Unremarkable bones. Mild right sphenoid and right maxillary sinus mucosal thickening and small right maxillary sinus retention cyst. IMPRESSION: 1. No acute abnormality. 2. Atrophy, chronic small vessel white matter ischemic changes and old lacunar infarcts. 3. Mild chronic sinusitis. Electronically Signed   By: Beckie Salts M.D.   On: 12/23/2015 17:11   Ct Head Wo Contrast  12/10/2015  CLINICAL DATA:  LEFT-sided weakness, last seen normal at 1000 hours, history hypertension, type II diabetes mellitus, dementia, hyperlipidemia EXAM: CT HEAD WITHOUT CONTRAST TECHNIQUE: Contiguous axial images were obtained from the base of the skull through the vertex without intravenous contrast. COMPARISON:  07/14/2015 FINDINGS: Generalized atrophy. Normal ventricular morphology. No midline shift or mass effect. Small vessel chronic ischemic changes of deep cerebral white matter. No intracranial hemorrhage, mass lesion, or acute infarction. Visualized paranasal sinuses and mastoid air cells clear. Bones unremarkable. Mild atherosclerotic calcification at carotid siphons. IMPRESSION: Atrophy with small vessel chronic ischemic changes of deep cerebral white matter. No acute intracranial abnormalities. Electronically Signed   By: Ulyses Southward M.D.   On: 12/10/2015 12:36   Mr Brain Wo Contrast  12/11/2015  CLINICAL DATA:  Stroke.  Left-sided weakness. EXAM: MRI HEAD WITHOUT CONTRAST MRA HEAD WITHOUT CONTRAST TECHNIQUE: Multiplanar, multiecho pulse sequences of the brain and surrounding structures were obtained without intravenous  contrast. Angiographic images of the head were obtained using MRA technique without contrast. COMPARISON:  CT head 12/10/2015 FINDINGS: MRI HEAD FINDINGS Acute infarct right pons measuring approximately 10 x 12 mm. No other acute infarct identified. Moderate atrophy with ventricular enlargement and prominent subarachnoid space diffusely. Chronic infarct left pons. Mild chronic changes in the cerebral white matter. No mass or edema. Small chronic micro hemorrhage in the left occipital lobe. Mucosal edema in the paranasal sinuses is mild.  No air-fluid level. MRA HEAD FINDINGS Suboptimal image quality due to patient motion and vessel tortuosity. Internal carotid artery patent bilaterally. Anterior and middle cerebral arteries patent bilaterally Right vertebral artery is dominant. There is an area of signal loss in the distal  right vertebral artery. While this could be due to tortuosity, there is suspected significant stenosis of the distal right vertebral artery at the vertebrobasilar junction. Left vertebral artery ends in PICA and does not appear to contribute to the basilar significantly. Basilar is patent. Posterior cerebral arteries are patent bilaterally. IMPRESSION: Acute infarct right pons Atrophy and chronic microvascular ischemia. Signal loss distal right vertebral artery may be due to atherosclerotic disease and may be a cause of the acute pontine infarct. There is tortuosity which may be also contributing to signal loss in distal right vertebral artery. Left vertebral artery does not appear to contribute to the basilar. If further arterial information is felt necessary, consider CTA of the head. Electronically Signed   By: Marlan Palau M.D.   On: 12/11/2015 11:43   Mr Laqueta Jean ZO Contrast  12/24/2015  CLINICAL DATA:  Initial evaluation for acute altered mental status. EXAM: MRI HEAD WITHOUT AND WITH CONTRAST TECHNIQUE: Multiplanar, multiecho pulse sequences of the brain and surrounding structures were  obtained without and with intravenous contrast. CONTRAST:  10mL MULTIHANCE GADOBENATE DIMEGLUMINE 529 MG/ML IV SOLN COMPARISON:  Recent MRI from 12/11/2015. FINDINGS: Diffuse prominence of the CSF containing spaces is compatible with generalized age-related cerebral atrophy. Patchy T2/FLAIR hyperintensity within the periventricular and deep white matter both cerebral hemispheres most consistent with chronic small vessel ischemic disease. Small remote lacunar infarcts within the bilateral thalami, and and left basal ganglia. Small remote pontine infarct noted as well. There has been normal expected interval evolution of recently identified right pontine infarct. Minimal diffusion signal abnormality persist. No associated hemorrhage or mass effect. No evidence for new infarction on today's exam. Gray-white matter differentiation maintained. Persistent abnormal flow void within the distal right vertebral artery, stable. Major intracranial vascular flow voids otherwise maintained. No mass lesion, midline shift, or mass effect. Ventricular prominence related to global parenchymal volume loss present without hydrocephalus. No extra-axial fluid collection. Minimal patchy post-contrast enhancement about the subacute right pontine infarct. No other abnormal enhancement. Probable slow flow within the distal right transverse sinus noted. Craniocervical junction within normal limits. Multilevel degenerative spondylolysis present within the visualized upper cervical spine. Pituitary gland grossly normal. No acute abnormality about the orbits. Sequela prior bilateral lens extraction. Mild mucosal thickening within the right maxillary sinus and ethmoidal air cells. No air-fluid levels to suggest active sinus infection. Small amount of opacity within the right mastoid air cells. Bone marrow signal intensity within normal limits. No scalp soft tissue abnormality. IMPRESSION: 1. Normal expected interval evolution of subacute right  pontine infarct. No associated mass effect or hemorrhage. 2. No new intracranial process. 3. Advanced atrophy with chronic small vessel ischemic disease, stable. Electronically Signed   By: Rise Mu M.D.   On: 12/24/2015 22:19   Ct Abdomen Pelvis W Contrast  12/23/2015  CLINICAL DATA:  Generalized abdominal pain and distention. Altered mental status. EXAM: CT ABDOMEN AND PELVIS WITH CONTRAST TECHNIQUE: Multidetector CT imaging of the abdomen and pelvis was performed using the standard protocol following bolus administration of intravenous contrast. CONTRAST:  80mL OMNIPAQUE IOHEXOL 300 MG/ML  SOLN COMPARISON:  11/26/2013 FINDINGS: Lower chest:  No acute findings. Hepatobiliary: Several tiny hepatic cysts are again noted, however no liver masses are identified. Prior cholecystectomy noted. No evidence of biliary dilatation. Pancreas: No mass, inflammatory changes, or other significant abnormality. Spleen: Within normal limits in size and appearance. Adrenals/Urinary Tract: Adrenal glands are unremarkable. Small bilateral renal cysts are again noted. No evidence of renal masses or hydronephrosis.  No evidence of ureteral calculi or dilatation. Stomach/Bowel: Small hiatal hernia again noted. No evidence of obstruction, inflammatory process, or abnormal fluid collections. Vascular/Lymphatic: No pathologically enlarged lymph nodes. No evidence of abdominal aortic aneurysm. Aortic atherosclerotic plaque noted. Reproductive: Prior hysterectomy noted. Adnexal regions are unremarkable in appearance. Other: None. Musculoskeletal:  No suspicious bone lesions identified. IMPRESSION: No acute findings identified within the abdomen or pelvis. Small hiatal hernia. Electronically Signed   By: Myles Rosenthal M.D.   On: 12/23/2015 17:21   Dg Chest Port 1 View  12/13/2015  CLINICAL DATA:  Fever EXAM: PORTABLE CHEST 1 VIEW COMPARISON:  12/10/2015 FINDINGS: Cardiac shadow is stable. Aortic calcifications are again seen.  Elevation of the right hemidiaphragm is noted. The lungs are again well aerated bilaterally. Minimal right basilar atelectasis is seen. IMPRESSION: Minimal right basilar atelectasis. Electronically Signed   By: Alcide Clever M.D.   On: 12/13/2015 18:42   Dg Chest Portable 1 View  12/10/2015  CLINICAL DATA:  Altered mental status. EXAM: PORTABLE CHEST 1 VIEW COMPARISON:  08/29/2014 FINDINGS: Cardiomediastinal silhouette is normal. Mediastinal contours appear intact. Tortuosity and calcified atherosclerotic disease of the aorta are noted. There is no evidence of focal airspace consolidation, pleural effusion or pneumothorax. Osseous structures are without acute abnormality. Soft tissues are grossly normal. IMPRESSION: No active disease. Electronically Signed   By: Ted Mcalpine M.D.   On: 12/10/2015 13:35   Mr Maxine Glenn Head/brain Wo Cm  12/11/2015  CLINICAL DATA:  Stroke.  Left-sided weakness. EXAM: MRI HEAD WITHOUT CONTRAST MRA HEAD WITHOUT CONTRAST TECHNIQUE: Multiplanar, multiecho pulse sequences of the brain and surrounding structures were obtained without intravenous contrast. Angiographic images of the head were obtained using MRA technique without contrast. COMPARISON:  CT head 12/10/2015 FINDINGS: MRI HEAD FINDINGS Acute infarct right pons measuring approximately 10 x 12 mm. No other acute infarct identified. Moderate atrophy with ventricular enlargement and prominent subarachnoid space diffusely. Chronic infarct left pons. Mild chronic changes in the cerebral white matter. No mass or edema. Small chronic micro hemorrhage in the left occipital lobe. Mucosal edema in the paranasal sinuses is mild.  No air-fluid level. MRA HEAD FINDINGS Suboptimal image quality due to patient motion and vessel tortuosity. Internal carotid artery patent bilaterally. Anterior and middle cerebral arteries patent bilaterally Right vertebral artery is dominant. There is an area of signal loss in the distal right vertebral  artery. While this could be due to tortuosity, there is suspected significant stenosis of the distal right vertebral artery at the vertebrobasilar junction. Left vertebral artery ends in PICA and does not appear to contribute to the basilar significantly. Basilar is patent. Posterior cerebral arteries are patent bilaterally. IMPRESSION: Acute infarct right pons Atrophy and chronic microvascular ischemia. Signal loss distal right vertebral artery may be due to atherosclerotic disease and may be a cause of the acute pontine infarct. There is tortuosity which may be also contributing to signal loss in distal right vertebral artery. Left vertebral artery does not appear to contribute to the basilar. If further arterial information is felt necessary, consider CTA of the head. Electronically Signed   By: Marlan Palau M.D.   On: 12/11/2015 11:43    Microbiology: Recent Results (from the past 240 hour(s))  Urine culture     Status: None   Collection Time: 12/23/15  3:00 PM  Result Value Ref Range Status   Specimen Description URINE, CATHETERIZED  Final   Special Requests NONE  Final   Culture >=100,000 COLONIES/mL ESCHERICHIA COLI  Final  Report Status 12/25/2015 FINAL  Final   Organism ID, Bacteria ESCHERICHIA COLI  Final      Susceptibility   Escherichia coli - MIC*    AMPICILLIN >=32 RESISTANT Resistant     CEFAZOLIN <=4 SENSITIVE Sensitive     CEFTRIAXONE <=1 SENSITIVE Sensitive     CIPROFLOXACIN >=4 RESISTANT Resistant     GENTAMICIN <=1 SENSITIVE Sensitive     IMIPENEM <=0.25 SENSITIVE Sensitive     NITROFURANTOIN <=16 SENSITIVE Sensitive     TRIMETH/SULFA <=20 SENSITIVE Sensitive     AMPICILLIN/SULBACTAM 16 INTERMEDIATE Intermediate     PIP/TAZO <=4 SENSITIVE Sensitive     * >=100,000 COLONIES/mL ESCHERICHIA COLI  MRSA PCR Screening     Status: None   Collection Time: 12/23/15  8:05 PM  Result Value Ref Range Status   MRSA by PCR NEGATIVE NEGATIVE Final    Comment:        The  GeneXpert MRSA Assay (FDA approved for NASAL specimens only), is one component of a comprehensive MRSA colonization surveillance program. It is not intended to diagnose MRSA infection nor to guide or monitor treatment for MRSA infections.      Labs: Basic Metabolic Panel:  Recent Labs Lab 12/23/15 1445 12/24/15 0603 12/25/15 0518  NA 154* 151* 148*  K 3.5 3.4* 3.1*  CL 116* 115* 114*  CO2 GLUCOSE 203* 185* 166*  BUN CREATININE 0.96 0.78 0.79  CALCIUM 9.7 9.3 9.1   Liver Function Tests:  Recent Labs Lab 12/23/15 1445  AST 13*  ALT 15  ALKPHOS 63  BILITOT 0.4  PROT 6.5  ALBUMIN 2.4*   No results for input(s): LIPASE, AMYLASE in the last 168 hours. No results for input(s): AMMONIA in the last 168 hours. CBC:  Recent Labs Lab 12/23/15 1445 12/24/15 0603  WBC 7.4 7.8  NEUTROABS 5.7  --   HGB 11.1* 11.3*  HCT 35.7* 36.6  MCV 92.0 92.2  PLT 299 288   Cardiac Enzymes: No results for input(s): CKTOTAL, CKMB, CKMBINDEX, TROPONINI in the last 168 hours. BNP: BNP (last 3 results) No results for input(s): BNP in the last 8760 hours.  ProBNP (last 3 results) No results for input(s): PROBNP in the last 8760 hours.  CBG:  Recent Labs Lab 12/23/15 2116 12/24/15 0811 12/24/15 1219 12/24/15 1719 12/25/15 0819  GLUCAP 155* 155* 148* 161* 166*       Signed:  GHERGHE, COSTIN  [facility] 12/25/2015, 10:27 AM

## 2015-12-25 NOTE — Progress Notes (Signed)
Patient will DC to: [facility] Anticipated DC date: 12/25/15 Family notified: Printmaker by: PTAR 1:30pm  CSW signing off.  Cristobal Goldmann, [place] Clinical Social Worker [phone]

## 2015-12-25 NOTE — Progress Notes (Addendum)
ANTICOAGULATION CONSULT NOTE - Initial Consult  Pharmacy Consult for Apixaban Indication: New RUE DVT, recent CVA  No Known Allergies  Patient Measurements: Weight: 117 lb 8.1 oz (53.3 kg)  Vital Signs:    Labs:  Recent Labs  12/23/15 1445 12/24/15 0603 12/25/15 0518  HGB 11.1* 11.3*  --   HCT 35.7* 36.6  --   PLT 299 288  --   CREATININE 0.96 0.78 0.79    Estimated Creatinine Clearance: 35.2 mL/min (by C-G formula based on Cr of 0.79).   Medical History: Past Medical History  Diagnosis Date  . Microalbuminuria   . Pyuria   . Dermatitis   . Dementia   . Diabetes mellitus without complication (HCC)   . Hyperlipidemia   . Hypertension     Assessment: 21 YOF with a recent CVA earlier this month with tPA administered who represented to the [facility] on [facility]/22 with AMS thought to be related to a UTI. Dopplers on 2/23 were positive for a RUE DVT and pharmacy was consulted to start apixaban.  Given the patient's recent CVA, a MRI was done to rule out any hemorrhagic conversion - which was negative. The patient is noted to have been on low dose enoxaparin for DVT prophylaxis - the last dose was at 2358 on 2/23. It is still okay to start apixaban dosing this morning.   Goal of Therapy:  Appropriate anticoagulation for indication and hepatic/renal function  Plan:  1.Discontinue lovenox for VTE prophylaxis 2. Start apixaban 10 mg twice daily for 7 days then drop down to 5 mg bid 3. Will monitor CBC and for any signs/symptoms of bleeding  Georgina Pillion, PharmD, BCPS Clinical Pharmacist Pager: [phone] 12/25/2015 7:39 AM

## 2015-12-25 NOTE — Progress Notes (Signed)
Nsg Discharge Note  Admit Date:  12/23/2015 Discharge date: 12/25/2015   Elige [facility] to be D/C'd Nursing Home per MD order.  AVS completed.  Copy for chart, and copy for patient signed, and dated. Patient/caregiver able to verbalize understanding.  Discharge Medication:   Medication List    STOP taking these medications        aspirin 325 MG EC tablet      TAKE these medications        acetaminophen 325 MG tablet  Commonly known as:  TYLENOL  Take 650 mg by mouth every 6 (six) hours as needed for mild pain or moderate pain.     amLODipine 10 MG tablet  Commonly known as:  NORVASC  Take 10 mg by mouth daily.     apixaban 5 MG Tabs tablet  Commonly known as:  ELIQUIS  Take 2 tablets (10 mg total) by mouth 2 (two) times daily. 10 gm BID for 7 days then 5 mg BID     atorvastatin 10 MG tablet  Commonly known as:  LIPITOR  Take 1 tablet (10 mg total) by mouth daily.     cephALEXin 500 MG capsule  Commonly known as:  KEFLEX  Take 1 capsule (500 mg total) by mouth every 12 (twelve) hours.     cholecalciferol 1000 units tablet  Commonly known as:  VITAMIN D  Take 1,000 Units by mouth daily.     donepezil 10 MG tablet  Commonly known as:  ARICEPT  Take 10 mg by mouth at bedtime.     feeding supplement (ENSURE ENLIVE) Liqd  Take 237 mLs by mouth 2 (two) times daily between meals.     ferrous sulfate 325 (65 FE) MG tablet  Take 325 mg by mouth daily with breakfast.     lisinopril 20 MG tablet  Commonly known as:  PRINIVIL,ZESTRIL  Take 1 tablet (20 mg total) by mouth daily.     metFORMIN 1000 MG tablet  Commonly known as:  GLUCOPHAGE  Take 1,000 mg by mouth 2 (two) times daily with a meal.     polyethylene glycol packet  Commonly known as:  MIRALAX / GLYCOLAX  Take 17 g by mouth daily.     senna 8.6 MG Tabs tablet  Commonly known as:  SENOKOT  Take 1 tablet by mouth daily.        Discharge Assessment: Filed Vitals:   12/24/15 0641 12/24/15 1355  BP:  167/68 164/73  Pulse: 57 62  Temp: 97.7 F (36.5 C) 98.2 F (36.8 C)  Resp: 18 18   Skin clean, dry and intact without evidence of skin break down, no evidence of skin tears noted. IV catheter discontinued intact. Site without signs and symptoms of complications - no redness or edema noted at insertion site, patient denies c/o pain - only slight tenderness at site.  Dressing with slight pressure applied.  D/c Instructions-Education: Discharge instructions given to patient/family with verbalized understanding. D/c education completed with patient/family including follow up instructions, medication list, d/c activities limitations if indicated, with other d/c instructions as indicated by MD - patient able to verbalize understanding, all questions fully answered. Patient instructed to return to ED, call 911, or call MD for any changes in condition.  Patient transported via [facility] to return to [facility].   Camillo Flaming, RN 12/25/2015 1:06 PM

## 2015-12-25 NOTE — Discharge Instructions (Signed)
Information on my medicine - ELIQUIS (apixaban)  This medication education was reviewed with me or my healthcare representative as part of my discharge preparation.  The pharmacist that spoke with me during my hospital stay was: Rolley Sims, [facility]  Why was Eliquis prescribed for you? Eliquis was prescribed to treat blood clots that may have been found in the veins of your legs (deep vein thrombosis) or in your lungs (pulmonary embolism) and to reduce the risk of them occurring again.  What do You need to know about Eliquis ? The starting dose is 10 mg (two 5 mg tablets) taken TWICE daily for the FIRST SEVEN (7) DAYS, then on Friday, March 3rd, 2017  the dose is reduced to ONE 5 mg tablet taken TWICE daily.  Eliquis may be taken with or without food.   Try to take the dose about the same time in the morning and in the evening. If you have difficulty swallowing the tablet whole please discuss with your pharmacist how to take the medication safely.  Take Eliquis exactly as prescribed and DO NOT stop taking Eliquis without talking to the doctor who prescribed the medication.  Stopping may increase your risk of developing a new blood clot.  Refill your prescription before you run out.  After discharge, you should have regular check-up appointments with your healthcare provider that is prescribing your Eliquis.    What do you do if you miss a dose? If a dose of ELIQUIS is not taken at the scheduled time, take it as soon as possible on the same day and twice-daily administration should be resumed. The dose should not be doubled to make up for a missed dose.  Important Safety Information A possible side effect of Eliquis is bleeding. You should call your healthcare provider right away if you experience any of the following: ? Bleeding from an injury or your nose that does not stop. ? Unusual colored urine (red or dark brown) or unusual colored stools (red or black). ? Unusual  bruising for unknown reasons. ? A serious fall or if you hit your head (even if there is no bleeding).  Some medicines may interact with Eliquis and might increase your risk of bleeding or clotting while on Eliquis. To help avoid this, consult your healthcare provider or pharmacist prior to using any new prescription or non-prescription medications, including herbals, vitamins, non-steroidal anti-inflammatory drugs (NSAIDs) and supplements.  This website has more information on Eliquis (apixaban): http://www.eliquis.com/eliquis/home

## 2016-02-18 ENCOUNTER — Ambulatory Visit: Payer: Medicare Other | Admitting: Neurology

## 2016-02-29 DEATH — deceased

## 2016-03-25 ENCOUNTER — Other Ambulatory Visit: Payer: Self-pay

## 2016-03-25 NOTE — Patient Outreach (Signed)
Telephone outreach to patient to obtain mRS. Number listed in patient chart is emergency contact, Gloria BurkittJohn Velasquez. Mr. Gloria BlackwaterHeaden informed caller that patient expired on 2016/06/20.  mRS = 6.

## 2016-05-18 IMAGING — CR DG CHEST 2V
2 series · 2 of 2 positions shown · non-contrast
Comparison: 08/19/2011

CLINICAL DATA: Abdominal pain, chest pain for a few days.

EXAM:
CHEST  2 VIEW

[w chest lat]
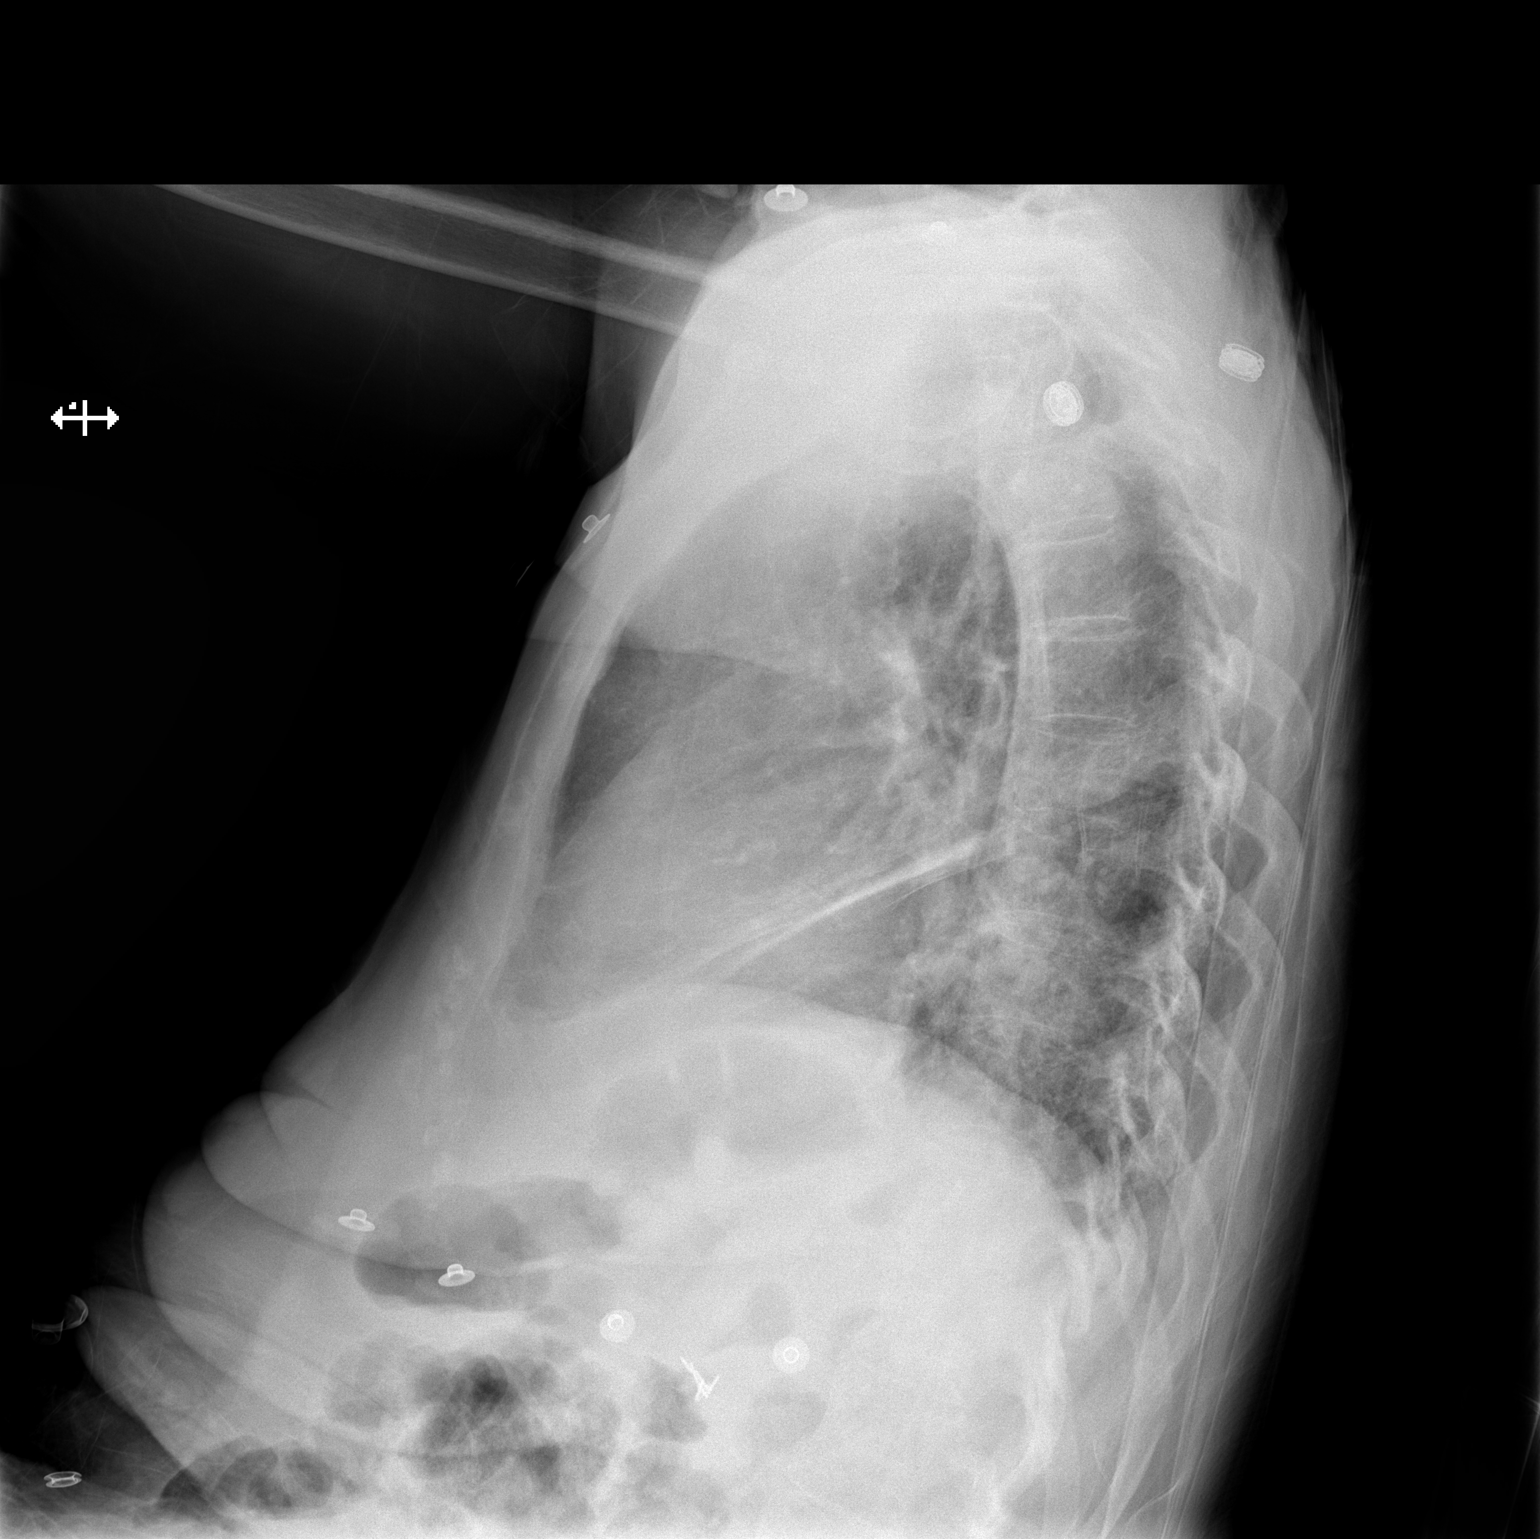

[w chest pa]
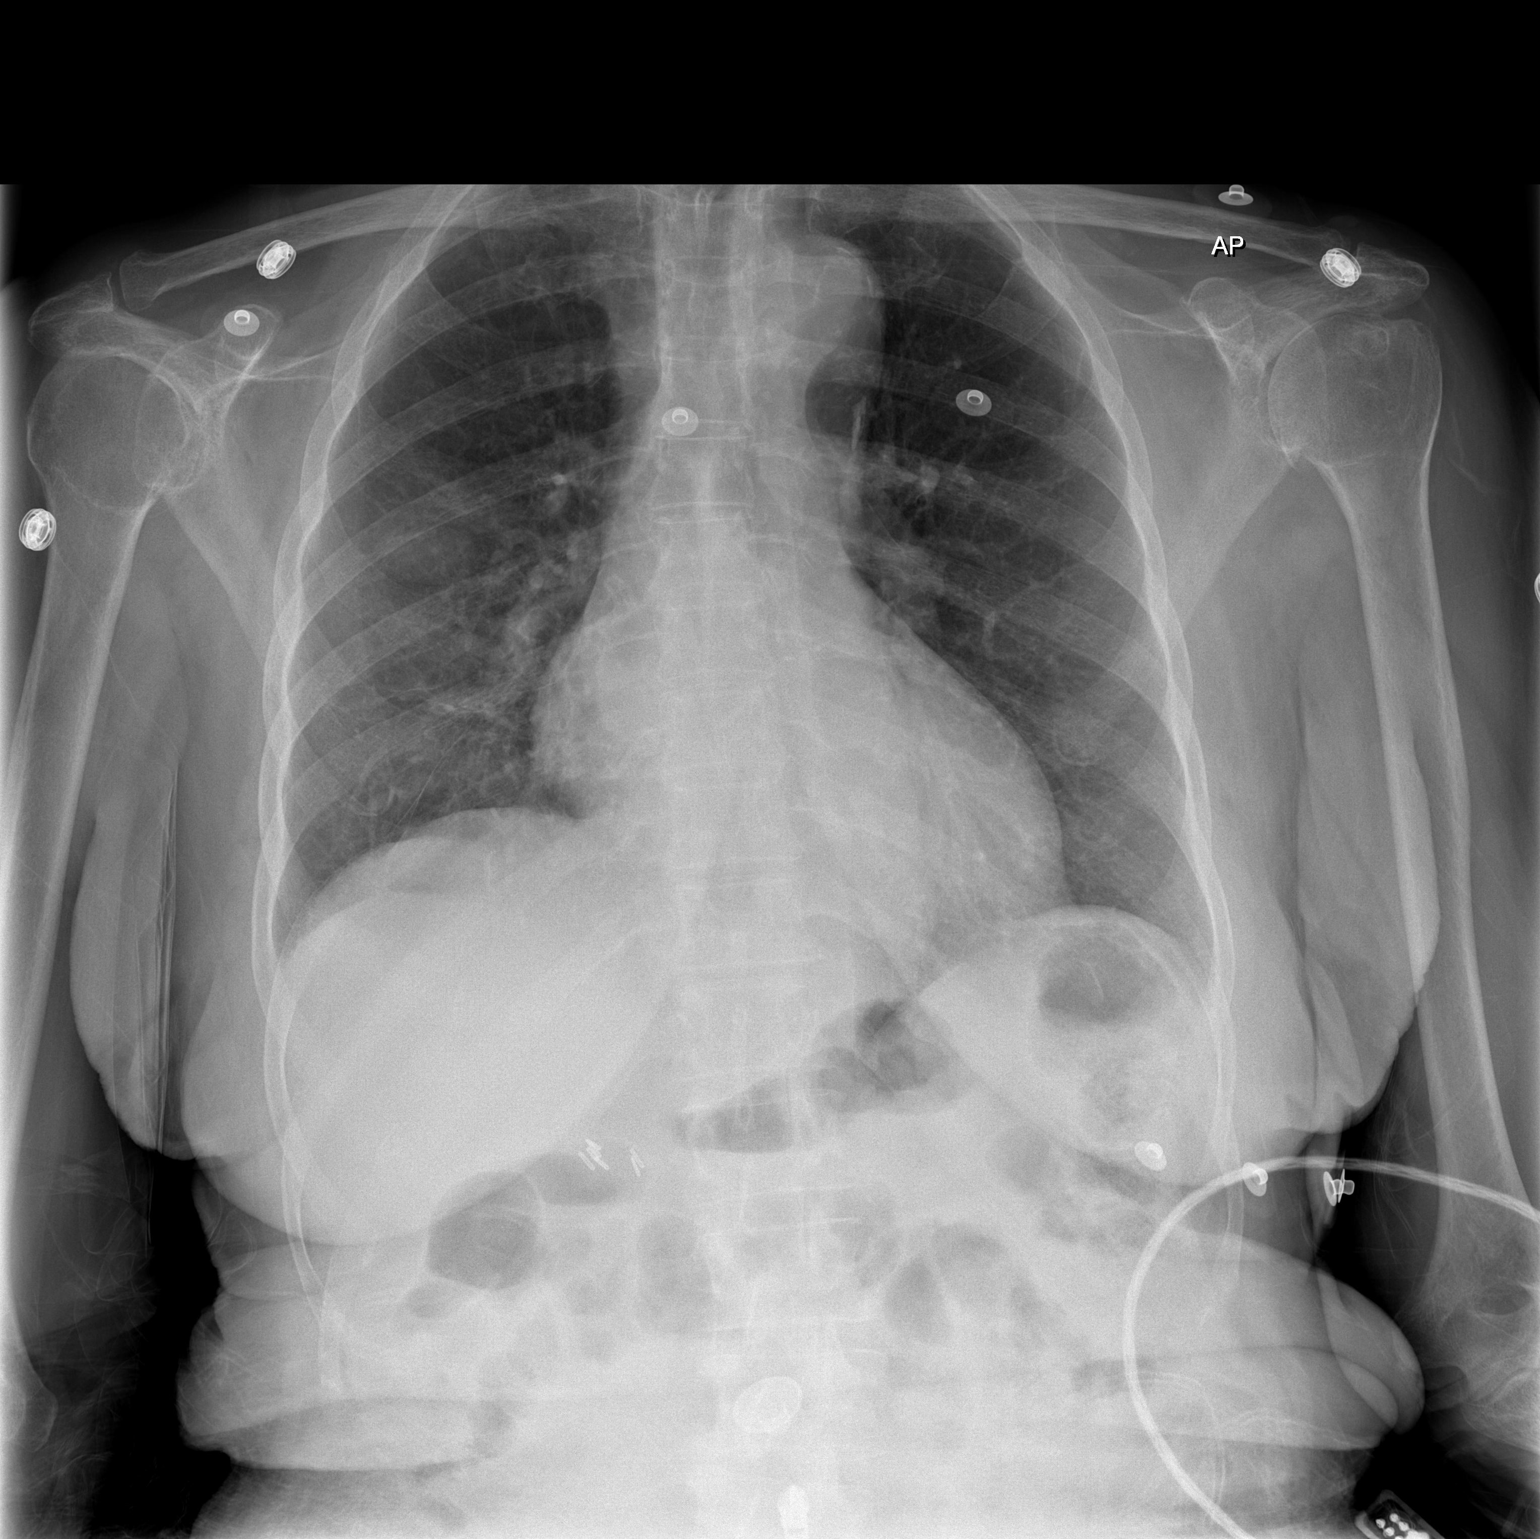

[2 of 2 positions shown; findings below may reference images not displayed]

FINDINGS: Airspace disease in the left lower lobe concerning for pneumonia.
Fluid within the fissure on the lateral view. Heart is borderline in
size. Mediastinal contours are within normal limits. No acute bony
abnormality.
IMPRESSION: Airspace opacity posteriorly on the lateral view, likely within the
left lower lobe. Cannot exclude pneumonia.
